# Patient Record
Sex: Male | Born: 1945 | Race: Black or African American | Hispanic: No | Smoking: Never smoker
Health system: Southern US, Community
[De-identification: ages and names within clinical notes are randomized; demographics above are authoritative.]

## PROBLEM LIST (undated history)

## (undated) DIAGNOSIS — E785 Hyperlipidemia, unspecified: Secondary | ICD-10-CM

## (undated) DIAGNOSIS — I10 Essential (primary) hypertension: Secondary | ICD-10-CM

## (undated) DIAGNOSIS — C801 Malignant (primary) neoplasm, unspecified: Secondary | ICD-10-CM

## (undated) DIAGNOSIS — Z87442 Personal history of urinary calculi: Secondary | ICD-10-CM

## (undated) DIAGNOSIS — R3129 Other microscopic hematuria: Secondary | ICD-10-CM

## (undated) DIAGNOSIS — K8020 Calculus of gallbladder without cholecystitis without obstruction: Secondary | ICD-10-CM

## (undated) HISTORY — PX: COLON SURGERY: SHX602

## (undated) HISTORY — DX: Calculus of gallbladder without cholecystitis without obstruction: K80.20

## (undated) HISTORY — DX: Hyperlipidemia, unspecified: E78.5

## (undated) HISTORY — DX: Essential (primary) hypertension: I10

## (undated) HISTORY — PX: COLONOSCOPY: SHX174

## (undated) HISTORY — DX: Other microscopic hematuria: R31.29

---

## 2004-11-30 ENCOUNTER — Ambulatory Visit: Payer: Self-pay | Admitting: Family Medicine

## 2005-07-27 ENCOUNTER — Ambulatory Visit: Payer: Self-pay | Admitting: Family Medicine

## 2005-12-29 ENCOUNTER — Ambulatory Visit: Payer: Self-pay | Admitting: Family Medicine

## 2006-11-14 ENCOUNTER — Ambulatory Visit: Payer: Self-pay | Admitting: Family Medicine

## 2010-06-09 ENCOUNTER — Encounter: Payer: Self-pay | Admitting: Gastroenterology

## 2010-06-10 ENCOUNTER — Ambulatory Visit: Payer: Self-pay | Admitting: Gastroenterology

## 2010-06-10 ENCOUNTER — Ambulatory Visit (HOSPITAL_COMMUNITY): Admission: RE | Admit: 2010-06-10 | Discharge: 2010-06-10 | Payer: Self-pay | Admitting: Gastroenterology

## 2010-06-17 ENCOUNTER — Encounter: Payer: Self-pay | Admitting: Gastroenterology

## 2010-06-17 DIAGNOSIS — C189 Malignant neoplasm of colon, unspecified: Secondary | ICD-10-CM | POA: Insufficient documentation

## 2010-06-18 ENCOUNTER — Encounter: Payer: Self-pay | Admitting: Gastroenterology

## 2010-06-19 LAB — CONVERTED CEMR LAB
ALT: 25 units/L (ref 0–53)
AST: 23 units/L (ref 0–37)
Alkaline Phosphatase: 53 units/L (ref 39–117)
Basophils Relative: 1 % (ref 0–1)
Creatinine, Ser: 1.13 mg/dL (ref 0.40–1.50)
Eosinophils Absolute: 0.1 10*3/uL (ref 0.0–0.7)
INR: 0.98 (ref <1.50)
MCHC: 34.1 g/dL (ref 30.0–36.0)
MCV: 90.7 fL (ref 78.0–100.0)
Monocytes Relative: 11 % (ref 3–12)
Neutro Abs: 2.3 10*3/uL (ref 1.7–7.7)
Neutrophils Relative %: 61 % (ref 43–77)
Platelets: 194 10*3/uL (ref 150–400)
Prothrombin Time: 13.2 s (ref 11.6–15.2)
RBC: 5.07 M/uL (ref 4.22–5.81)
Sodium: 136 meq/L (ref 135–145)
Total Bilirubin: 0.7 mg/dL (ref 0.3–1.2)
Total Protein: 7 g/dL (ref 6.0–8.3)
WBC: 3.8 10*3/uL — ABNORMAL LOW (ref 4.0–10.5)

## 2010-06-22 ENCOUNTER — Ambulatory Visit (HOSPITAL_COMMUNITY): Admission: RE | Admit: 2010-06-22 | Discharge: 2010-06-22 | Payer: Self-pay | Admitting: Gastroenterology

## 2010-09-04 ENCOUNTER — Encounter (INDEPENDENT_AMBULATORY_CARE_PROVIDER_SITE_OTHER): Payer: Self-pay | Admitting: General Surgery

## 2010-09-04 ENCOUNTER — Inpatient Hospital Stay (HOSPITAL_COMMUNITY): Admission: RE | Admit: 2010-09-04 | Discharge: 2010-09-06 | Payer: Self-pay | Admitting: General Surgery

## 2010-11-14 ENCOUNTER — Encounter: Payer: Self-pay | Admitting: Unknown Physician Specialty

## 2010-11-24 NOTE — Letter (Signed)
Summary: referral to [facility]  referral to [facility]   Imported By: Peggyann Shoals 06/18/2010 12:46:25  _____________________________________________________________________  External Attachment:    Type:   Image     Comment:   External Document

## 2010-11-24 NOTE — Letter (Signed)
Summary: CT order   CT order   Imported By: Peggyann Shoals 06/18/2010 13:11:58  _____________________________________________________________________  External Attachment:    Type:   Image     Comment:   External Document

## 2010-11-24 NOTE — Miscellaneous (Signed)
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

## 2010-11-24 NOTE — Letter (Signed)
Summary: TRIAGE  TRIAGE   Imported By: Rexene Alberts 06/09/2010 09:13:14  _____________________________________________________________________  External Attachment:    Type:   Image     Comment:   External Document

## 2011-01-05 LAB — CBC
HCT: 36.7 % — ABNORMAL LOW (ref 39.0–52.0)
HCT: 41.4 % (ref 39.0–52.0)
HCT: 48.2 % (ref 39.0–52.0)
Hemoglobin: 12.8 g/dL — ABNORMAL LOW (ref 13.0–17.0)
Hemoglobin: 14 g/dL (ref 13.0–17.0)
Hemoglobin: 16.1 g/dL (ref 13.0–17.0)
MCH: 30.8 pg (ref 26.0–34.0)
MCH: 31.6 pg (ref 26.0–34.0)
MCHC: 33.3 g/dL (ref 30.0–36.0)
MCHC: 34.7 g/dL (ref 30.0–36.0)
MCV: 90.9 fL (ref 78.0–100.0)
MCV: 92.2 fL (ref 78.0–100.0)
MCV: 92.4 fL (ref 78.0–100.0)
RBC: 4.04 MIL/uL — ABNORMAL LOW (ref 4.22–5.81)
RDW: 12.6 % (ref 11.5–15.5)
WBC: 5.9 10*3/uL (ref 4.0–10.5)

## 2011-01-05 LAB — COMPREHENSIVE METABOLIC PANEL
ALT: 23 U/L (ref 0–53)
BUN: 10 mg/dL (ref 6–23)
CO2: 29 mEq/L (ref 19–32)
Calcium: 9.8 mg/dL (ref 8.4–10.5)
GFR calc non Af Amer: >60 mL/min (ref >60)
Glucose, Bld: 126 mg/dL — ABNORMAL HIGH (ref 70–99)
Sodium: 137 mEq/L (ref 135–145)

## 2011-01-05 LAB — DIFFERENTIAL
Basophils Relative: 0 % (ref 0–1)
Basophils Relative: 0 % (ref 0–1)
Eosinophils Absolute: 0 10*3/uL (ref 0.0–0.7)
Eosinophils Relative: 0 % (ref 0–5)
Lymphocytes Relative: 10 % — ABNORMAL LOW (ref 12–46)
Lymphs Abs: 0.8 10*3/uL (ref 0.7–4.0)
Monocytes Absolute: 0.5 10*3/uL (ref 0.1–1.0)
Monocytes Relative: 9 % (ref 3–12)
Neutrophils Relative %: 78 % — ABNORMAL HIGH (ref 43–77)
Neutrophils Relative %: 82 % — ABNORMAL HIGH (ref 43–77)

## 2011-01-05 LAB — BASIC METABOLIC PANEL
CO2: 27 mEq/L (ref 19–32)
Calcium: 8.9 mg/dL (ref 8.4–10.5)
Chloride: 103 mEq/L (ref 96–112)
Chloride: 106 mEq/L (ref 96–112)
GFR calc Af Amer: >60 mL/min (ref >60)
GFR calc non Af Amer: >60 mL/min (ref >60)
Glucose, Bld: 125 mg/dL — ABNORMAL HIGH (ref 70–99)
Glucose, Bld: 130 mg/dL — ABNORMAL HIGH (ref 70–99)
Potassium: 3.7 mEq/L (ref 3.5–5.1)
Potassium: 3.7 mEq/L (ref 3.5–5.1)
Sodium: 134 mEq/L — ABNORMAL LOW (ref 135–145)
Sodium: 138 mEq/L (ref 135–145)

## 2011-01-05 LAB — PHOSPHORUS: Phosphorus: 3.2 mg/dL (ref 2.3–4.6)

## 2011-01-05 LAB — ALBUMIN: Albumin: 3.2 g/dL — ABNORMAL LOW (ref 3.5–5.2)

## 2011-01-05 LAB — TYPE AND SCREEN: Antibody Screen: NEGATIVE

## 2011-01-05 LAB — SURGICAL PCR SCREEN: Staphylococcus aureus: NEGATIVE

## 2011-05-27 ENCOUNTER — Encounter: Payer: Self-pay | Admitting: Gastroenterology

## 2011-06-10 ENCOUNTER — Ambulatory Visit: Payer: Self-pay | Admitting: Gastroenterology

## 2011-07-15 ENCOUNTER — Ambulatory Visit (INDEPENDENT_AMBULATORY_CARE_PROVIDER_SITE_OTHER): Payer: PRIVATE HEALTH INSURANCE | Admitting: Gastroenterology

## 2011-07-15 ENCOUNTER — Encounter: Payer: Self-pay | Admitting: Gastroenterology

## 2011-07-15 VITALS — BP 130/82 | HR 84 | Temp 97.6°F | Ht 74.0 in | Wt 221.2 lb

## 2011-07-15 DIAGNOSIS — C189 Malignant neoplasm of colon, unspecified: Secondary | ICD-10-CM

## 2011-07-15 NOTE — Progress Notes (Signed)
Cc to PCP 

## 2011-07-15 NOTE — Progress Notes (Signed)
Reminder in epic to have tcs in 2015 and 2020

## 2011-07-15 NOTE — Assessment & Plan Note (Signed)
TCS 2012, 2015, 2020. MOVIPREP OPV PRN

## 2011-07-15 NOTE — Progress Notes (Signed)
  Subjective:    Patient ID: Scott Oliver, male    DOB: June 02, 1946, 65 y.o.   MRN: [id number]  PCP: [facility]  HPI Pt had HEPATIC FLEXURE polyp containing ADENOCA NOV 2011-R HEMICOLECTOMY-no residual CA or POS LNS. Appetite: good. USU. BMS: IN am & pm. ONCE TAKES PREP, GETS BOWELS OUT OF WACK-1 week or so and they get right.  No problems with sedation. No nausea, vomiting, melena, diarrhea, constipation, aBd pain, OR problems swallowing. RARE heartburn or indigestion, relieved by Nexium.  Past Medical History  Diagnosis Date  . HTN (hypertension)   . Hyperlipemia     Past Surgical History  Procedure Date  . Colon surgery NOV 2011 MJ    R HEMI  . Colonoscopy AUG 2011 ARS    HF POLYP W/ FOCI OF ADENO CA, SIMPLE ADENOMAS    No Known Allergies  Current Outpatient Prescriptions  Medication Sig Dispense Refill  . aspirin 81 MG tablet Take 81 mg by mouth daily.        Marland Kitchen lisinopril-hydrochlorothiazide (PRINZIDE,ZESTORETIC) 20-12.5 MG per tablet Take 1 tablet by mouth daily.        . Omega-3 Fatty Acids (FISH OIL) 1000 MG CPDR Take by mouth.        . Pitavastatin Calcium 4 MG TABS Take by mouth.         Family History  Problem Relation Age of Onset  . Colon cancer Neg Hx   . Colon polyps Neg Hx    History   Social History Narrative   HIS WIFE, [name], PASSED AWAY RECENTLY.            Review of Systems     Objective:   Physical Exam  Constitutional: He is oriented to person, place, and time. He appears well-developed and well-nourished. No distress.  HENT:  Head: Normocephalic and atraumatic.  Mouth/Throat: Oropharynx is clear and moist. No oropharyngeal exudate.  Eyes: Pupils are equal, round, and reactive to light. No scleral icterus.  Neck: Normal range of motion. Neck supple.  Cardiovascular: Normal rate, regular rhythm and normal heart sounds.   Pulmonary/Chest: Effort normal and breath sounds normal.  Abdominal: Soft. Bowel sounds are normal. He exhibits  no distension. There is no tenderness.  Musculoskeletal: Normal range of motion. He exhibits no edema.  Lymphadenopathy:    He has no cervical adenopathy.  Neurological: He is alert and oriented to person, place, and time.       No focal deficits  Psychiatric: He has a normal mood and affect.          Assessment & Plan:

## 2011-07-24 ENCOUNTER — Emergency Department (HOSPITAL_COMMUNITY)
Admission: EM | Admit: 2011-07-24 | Discharge: 2011-07-25 | Disposition: A | Discharge status: Home / Self Care | Payer: Medicare Other | Attending: Emergency Medicine | Admitting: Emergency Medicine

## 2011-07-24 ENCOUNTER — Encounter (HOSPITAL_COMMUNITY): Payer: Self-pay | Admitting: *Deleted

## 2011-07-24 DIAGNOSIS — N200 Calculus of kidney: Secondary | ICD-10-CM | POA: Insufficient documentation

## 2011-07-24 DIAGNOSIS — R109 Unspecified abdominal pain: Secondary | ICD-10-CM | POA: Insufficient documentation

## 2011-07-24 DIAGNOSIS — R110 Nausea: Secondary | ICD-10-CM | POA: Insufficient documentation

## 2011-07-24 NOTE — ED Notes (Signed)
Pt reports pain in left flank previously today, radiating to left groin with urination.  Pt did report that he had some difficulty urinating earlier.  States that he was able to void some, and at this time, is not experiencing any pain at all. Pt ambulating with no difficulty.  To BR to provide urine sample.

## 2011-07-24 NOTE — ED Notes (Signed)
Pt c/o left flank pain radiating around to lower abdomen.

## 2011-07-25 ENCOUNTER — Emergency Department (HOSPITAL_COMMUNITY): Payer: Medicare Other

## 2011-07-25 LAB — URINE MICROSCOPIC-ADD ON

## 2011-07-25 LAB — URINALYSIS, ROUTINE W REFLEX MICROSCOPIC
Bilirubin Urine: NEGATIVE
Glucose, UA: NEGATIVE mg/dL
Ketones, ur: NEGATIVE mg/dL
pH: 6 (ref 5.0–8.0)

## 2011-07-25 MED ORDER — PROMETHAZINE HCL 25 MG PO TABS: 25.0000 mg | ORAL_TABLET | Freq: Four times a day (QID) | ORAL | Status: AC | PRN

## 2011-07-25 MED ORDER — HYDROCODONE-ACETAMINOPHEN 5-325 MG PO TABS: 2.0000 | ORAL_TABLET | ORAL | Status: AC | PRN

## 2011-07-25 NOTE — ED Provider Notes (Signed)
History     CSN: [id number] Arrival date & time: 07/24/2011 11:29 PM  Chief Complaint  Patient presents with  . Flank Pain  . Nausea    (Consider location/radiation/quality/duration/timing/severity/associated sxs/prior treatment) HPI Comments: Examined at 0001. Patient with remote h/o kidney stone here with flank pain with radiation to left lower abdomen. Difficulty urinating initially, now able to urinate. Denies hematuria, fever, chills. Pain ahs improved with no intervention.  Patient is a 65 y.o. male presenting with flank pain.  Flank Pain This is a new problem. The current episode started 1 to 2 hours ago. The problem occurs constantly. The problem has been rapidly improving. Associated symptoms include abdominal pain. Pertinent negatives include no chest pain and no headaches. Associated symptoms comments: Left flank pain with radiation to left lower abdomen. The symptoms are relieved by nothing. He has tried nothing for the symptoms.    Past Medical History  Diagnosis Date  . HTN (hypertension)   . Hyperlipemia     Past Surgical History  Procedure Date  . Colon surgery NOV 2011 MJ    R HEMI  . Colonoscopy AUG 2011 ARS    HF POLYP W/ FOCI OF ADENO CA, SIMPLE ADENOMAS    Family History  Problem Relation Age of Onset  . Colon cancer Neg Hx   . Colon polyps Neg Hx     History  Substance Use Topics  . Smoking status: Never Smoker   . Smokeless tobacco: Not on file  . Alcohol Use: Yes     ocassional      Review of Systems  Cardiovascular: Negative for chest pain.  Gastrointestinal: Positive for abdominal pain.  Genitourinary: Positive for flank pain.  Neurological: Negative for headaches.  All other systems reviewed and are negative.    Allergies  Review of patient's allergies indicates no known allergies.  Home Medications   Current Outpatient Rx  Name Route Sig Dispense Refill  . LIVALO PO Oral Take by mouth.      . ASPIRIN 81 MG PO TABS Oral Take  81 mg by mouth daily.      Marland Kitchen LISINOPRIL-HYDROCHLOROTHIAZIDE 20-12.5 MG PO TABS Oral Take 1 tablet by mouth daily.      Marland Kitchen FISH OIL 1000 MG PO CPDR Oral Take by mouth.        BP 159/82  Pulse 88  Temp(Src) 98.2 F (36.8 C) (Oral)  Resp 16  Ht 6\' 2"  (1.88 m)  Wt 222 lb (100.699 kg)  BMI 28.50 kg/m2  SpO2 97%  Physical Exam  Nursing note and vitals reviewed. Constitutional: He is oriented to person, place, and time. He appears well-developed and well-nourished.  HENT:  Head: Normocephalic.  Right Ear: External ear normal.  Left Ear: External ear normal.  Mouth/Throat: Oropharynx is clear and moist.  Eyes: EOM are normal.  Neck: Normal range of motion. Neck supple.  Cardiovascular: Normal rate, normal heart sounds and intact distal pulses.   Pulmonary/Chest: Effort normal and breath sounds normal.  Abdominal: Soft. Bowel sounds are normal. There is no tenderness. There is no rebound and no guarding.  Genitourinary:       No cva tenderness to percussion  Musculoskeletal: Normal range of motion.  Neurological: He is alert and oriented to person, place, and time.  Skin: Skin is warm and dry.    ED Course  Procedures (including critical care time)  Labs Reviewed  URINALYSIS, ROUTINE W REFLEX MICROSCOPIC - Abnormal; Notable for the following:    Hgb urine dipstick  LARGE (*)    All other components within normal limits  URINE MICROSCOPIC-ADD ON   Ct Abdomen Pelvis Wo Contrast  07/25/2011  *RADIOLOGY REPORT*  Clinical Data: Left flank pain, evaluate for stone  CT ABDOMEN AND PELVIS WITHOUT CONTRAST  Technique:  Multidetector CT imaging of the abdomen and pelvis was performed following the standard protocol without intravenous contrast.  Comparison: 06/22/2010  Findings: Mild scarring in the right middle lobe and lingula.  Tiny hiatal hernia.  Unenhanced liver, spleen, pancreas, and adrenal glands within normal limits.  Cholelithiasis, without associate inflammatory changes.  Left  kidney is notable for a 2 mm nonobstructing upper pole calculus (series 2/image 24).  Right kidney is unremarkable.  No hydronephrosis.  Status post right hemicolectomy.  No evidence of bowel obstruction.  Atherosclerotic calcifications of the abdominal aorta and branch vessels.  No abdominopelvic ascites.  No suspicious abdominopelvic lymphadenopathy.  Prostate is unremarkable.  Mild stranding surrounding the mid/distal left ureter. Punctate calculus at the left UVJ (series 2/image 85).  Bladder is within normal limits.  Mild degenerative changes of the visualized thoracolumbar spine. Moderate degenerative changes of the bilateral hips.  IMPRESSION: Punctate calculus in the distal left ureter at the UVJ.  2 mm nonobstructing left upper pole calculus.  No hydronephrosis.  Cholelithiasis, without associated inflammatory changes.  Status post right hemicolectomy.  Original Report Authenticated By: Charline Bills, M.D.     No diagnosis found.   Patient with left flank pain and left lower abdominal pain c/w kidney stone. CT confirmed punctate stone at LUVJ. Patient is painfree. Reviewed CT results with patient and his family. Patient and familyr informed of clinical course, understand medical decision-making process, and agree with plan. MDM Reviewed: nursing note and vitals Interpretation: CT scan              Nicoletta Dress. Colon Branch, MD 07/25/11 6213

## 2011-08-04 ENCOUNTER — Telehealth: Payer: Self-pay | Admitting: Gastroenterology

## 2011-08-04 MED ORDER — ESOMEPRAZOLE MAGNESIUM 40 MG PO CPDR: 40.0000 mg | DELAYED_RELEASE_CAPSULE | Freq: Every day | ORAL | Status: DC

## 2011-08-04 NOTE — Telephone Encounter (Signed)
Pt returned call and was informed.  

## 2011-08-04 NOTE — Telephone Encounter (Signed)
Please call pt. Let him know I sent Rx to [facility] for Nexium.

## 2011-08-04 NOTE — Telephone Encounter (Signed)
Called, VM not set up.  

## 2011-08-04 NOTE — Telephone Encounter (Signed)
Pt has not been prescribed Nexium. Said he told Dr. Darrick Penna when he was here that he had taken some of his wife's and said it was OK if he took some, but he failed to get prescription or samples. #10 given today per Dr. Darrick Penna.

## 2011-08-04 NOTE — Telephone Encounter (Signed)
Pt would like samples of Nexium

## 2011-08-04 NOTE — Telephone Encounter (Signed)
Addended by: [place] on: 08/04/2011 11:54 AM   Modules accepted: Orders

## 2011-08-09 ENCOUNTER — Ambulatory Visit (HOSPITAL_COMMUNITY)
Admission: RE | Admit: 2011-08-09 | Discharge: 2011-08-09 | Disposition: A | Discharge status: Home / Self Care | Payer: Medicare Other | Source: Ambulatory Visit | Attending: Gastroenterology | Admitting: Gastroenterology

## 2011-08-09 ENCOUNTER — Encounter (HOSPITAL_COMMUNITY): Payer: Self-pay | Admitting: *Deleted

## 2011-08-09 ENCOUNTER — Encounter (HOSPITAL_COMMUNITY)
Admission: RE | Disposition: A | Discharge status: Home / Self Care | Payer: Self-pay | Source: Ambulatory Visit | Attending: Gastroenterology

## 2011-08-09 DIAGNOSIS — K5730 Diverticulosis of large intestine without perforation or abscess without bleeding: Secondary | ICD-10-CM

## 2011-08-09 DIAGNOSIS — Z860100 Personal history of colon polyps, unspecified: Secondary | ICD-10-CM

## 2011-08-09 DIAGNOSIS — K648 Other hemorrhoids: Secondary | ICD-10-CM

## 2011-08-09 DIAGNOSIS — E785 Hyperlipidemia, unspecified: Secondary | ICD-10-CM | POA: Insufficient documentation

## 2011-08-09 DIAGNOSIS — Z7982 Long term (current) use of aspirin: Secondary | ICD-10-CM | POA: Insufficient documentation

## 2011-08-09 DIAGNOSIS — I10 Essential (primary) hypertension: Secondary | ICD-10-CM | POA: Insufficient documentation

## 2011-08-09 DIAGNOSIS — Z09 Encounter for follow-up examination after completed treatment for conditions other than malignant neoplasm: Secondary | ICD-10-CM

## 2011-08-09 DIAGNOSIS — Z9049 Acquired absence of other specified parts of digestive tract: Secondary | ICD-10-CM | POA: Insufficient documentation

## 2011-08-09 DIAGNOSIS — Z79899 Other long term (current) drug therapy: Secondary | ICD-10-CM | POA: Insufficient documentation

## 2011-08-09 DIAGNOSIS — C189 Malignant neoplasm of colon, unspecified: Secondary | ICD-10-CM

## 2011-08-09 DIAGNOSIS — Z85038 Personal history of other malignant neoplasm of large intestine: Secondary | ICD-10-CM | POA: Insufficient documentation

## 2011-08-09 HISTORY — PX: COLONOSCOPY: SHX5424

## 2011-08-09 SURGERY — COLONOSCOPY
Anesthesia: Moderate Sedation

## 2011-08-09 MED ORDER — MIDAZOLAM HCL 5 MG/5ML IJ SOLN
INTRAMUSCULAR | Status: DC | PRN
  Administered 2011-08-09 (×2): 2 mg via INTRAVENOUS

## 2011-08-09 MED ORDER — MIDAZOLAM HCL 5 MG/5ML IJ SOLN
INTRAMUSCULAR | Status: AC
  Filled 2011-08-09: qty 10

## 2011-08-09 MED ORDER — MEPERIDINE HCL 100 MG/ML IJ SOLN
INTRAMUSCULAR | Status: DC | PRN
  Administered 2011-08-09: 50 mg via INTRAVENOUS
  Administered 2011-08-09: 25 mg via INTRAVENOUS

## 2011-08-09 MED ORDER — SODIUM CHLORIDE 0.45 % IV SOLN: Freq: Once | INTRAVENOUS | Status: DC

## 2011-08-09 MED ORDER — MEPERIDINE HCL 100 MG/ML IJ SOLN
INTRAMUSCULAR | Status: AC
  Filled 2011-08-09: qty 2

## 2011-08-09 NOTE — H&P (Signed)
Reason for Visit     consult for colonoscopy        Vitals - Last Recorded       BP Pulse Temp(Src) Ht Wt BMI    130/82  84  97.6 F (36.4 C) (Temporal)  6\' 2"  (1.88 m)  221 lb 3.2 oz (100.336 kg)  28.40 kg/m2       Progress Notes     [name], MD  07/15/2011 12:01 PM  Signed    Subjective:      Patient ID: Scott Oliver, male    DOB: [birth year], 65 y.o.   MRN: [id number]   PCP: [facility]   HPI Pt had HEPATIC FLEXURE polyp containing ADENOCA NOV 2011-R HEMICOLECTOMY-no residual CA or POS LNS. Appetite: good. USU. BMS: IN am & pm. ONCE TAKES PREP, GETS BOWELS OUT OF WACK-1 week or so and they get right.   No problems with sedation. No nausea, vomiting, melena, diarrhea, constipation, aBd pain, OR problems swallowing. RARE heartburn or indigestion, relieved by Nexium.    Past Medical History   Diagnosis  Date   .  HTN (hypertension)     .  Hyperlipemia         Past Surgical History   Procedure  Date   .  Colon surgery  NOV 2011 [facility]J       R HEMI   .  Colonoscopy  AUG 2011 ARS       HF POLYP W/ FOCI OF ADENO CA, SIMPLE ADENOMAS      No Known Allergies    Current Outpatient Prescriptions   Medication  Sig  Dispense  Refill   .  aspirin 81 MG tablet  Take 81 mg by mouth daily.           Marland Kitchen  lisinopril-hydrochlorothiazide (PRINZIDE,ZESTORETIC) 20-12.5 MG per tablet  Take 1 tablet by mouth daily.           .  Omega-3 Fatty Acids (FISH OIL) 1000 MG CPDR  Take by mouth.           .  Pitavastatin Calcium 4 MG TABS  Take by mouth.            Family History   Problem  Relation  Age of Onset   .  Colon cancer  Neg Hx     .  Colon polyps  Neg Hx      History       Social History Narrative     HIS WIFE, LUCY Olenik, PASSED AWAY RECENTLY.                    Review of Systems     Objective:    Physical Exam  Constitutional: He is oriented to person, place, and time. He appears well-developed and well-nourished. No distress.  HENT:   Head:  Normocephalic and atraumatic.   Mouth/Throat: Oropharynx is clear and moist. No oropharyngeal exudate.  Eyes: Pupils are equal, round, and reactive to light. No scleral icterus.  Neck: Normal range of motion. Neck supple.  Cardiovascular: Normal rate, regular rhythm and normal heart sounds.   Pulmonary/Chest: Effort normal and breath sounds normal.  Abdominal: Soft. Bowel sounds are normal. He exhibits no distension. There is no tenderness.  Musculoskeletal: Normal range of motion. He exhibits no edema.  Lymphadenopathy:    He has no cervical adenopathy.  Neurological: He is alert and oriented to person, place, and time.  No focal deficits  Psychiatric: He has a normal mood and affect.            Assessment & Plan:        Scott Oliver  07/15/2011  1:31 PM  Signed Cc to PCP  Ferne Reus  07/15/2011  4:42 PM  Signed Reminder in epic to have tcs in 2015 and 2020        ADENOCARCINOMA, COLON - [name], MD  07/15/2011 11:48 AM  Signed TCS 2012, 2015, 2020. MOVIPREP OPV PRN

## 2011-08-09 NOTE — Interval H&P Note (Signed)
History and Physical Interval Note:   08/09/2011   7:41 AM   Scott Oliver  has presented today for surgery, with the diagnosis of CANEROUS POLYP  The various methods of treatment have been discussed with the patient and family. After consideration of risks, benefits and other options for treatment, the patient has consented to  Procedure(s): COLONOSCOPY as a surgical intervention .  I have reviewed the patients' chart and labs.  Questions were answered to the patient's satisfaction.     [name]  MD  THE PATIENT WAS EXAMINED AND THERE IS NO CHANGE IN THE PATIENT'S CONDITION SINCE THE ORIGINAL H&P WAS COMPLETED.

## 2011-08-17 ENCOUNTER — Encounter (HOSPITAL_COMMUNITY): Payer: Self-pay | Admitting: Gastroenterology

## 2012-02-29 IMAGING — CT CT ABD-PELV W/O CM
2 of 4 series · 17 of 46 positions shown, 19 images · non-contrast
Comparison: 06/22/2010

CLINICAL DATA: Left flank pain, evaluate for stone

CT ABDOMEN AND PELVIS WITHOUT CONTRAST
TECHNIQUE: Multidetector CT imaging of the abdomen and pelvis was
performed following the standard protocol without intravenous
contrast.

[Series 2: standard/full over (age)lbs 5.0 · axial · 0.80mm/px · z∈[-550,-70]mm · 14 of 106 slices shown, 16 images]
[im 5/106  soft-tissue]
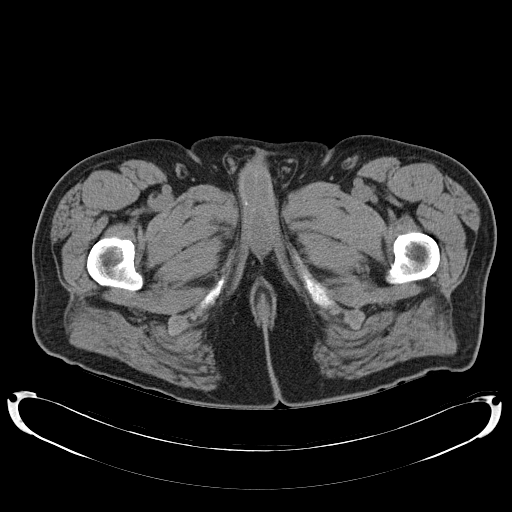
[im 5/106  bone]
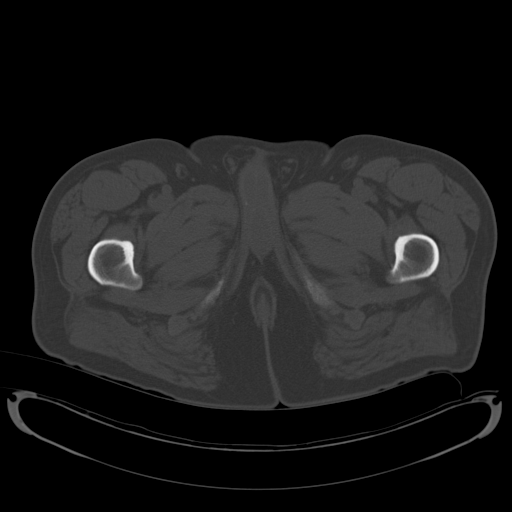
[im 13/106  soft-tissue]
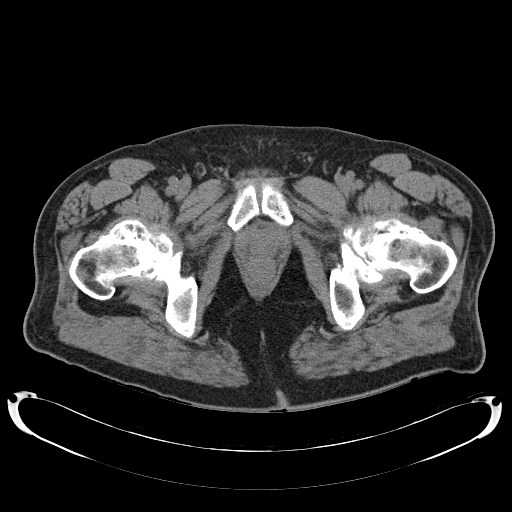
[im 22/106  soft-tissue]
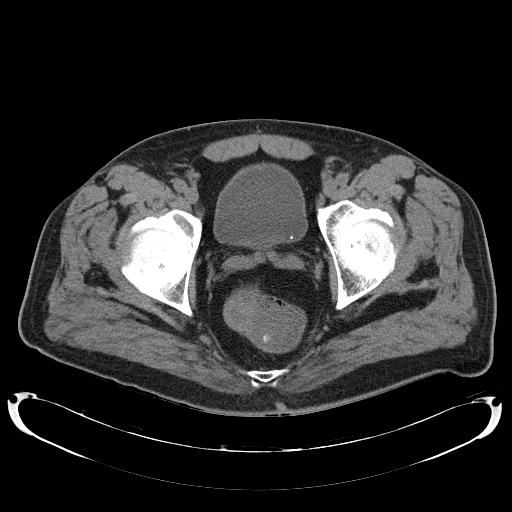
[im 30/106  soft-tissue]
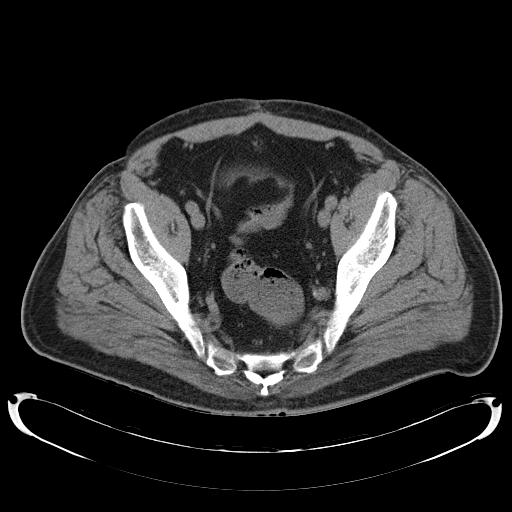
[im 34/106  soft-tissue]
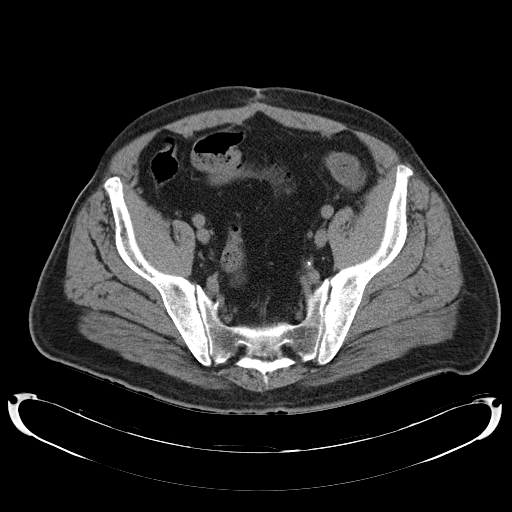
[im 43/106  soft-tissue]
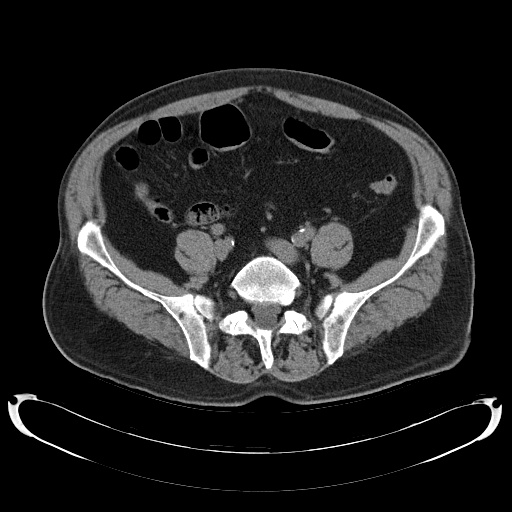
[im 51/106  soft-tissue]
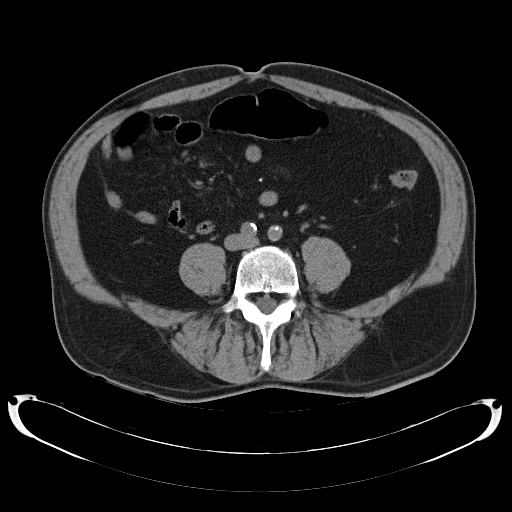
[im 55/106  soft-tissue]
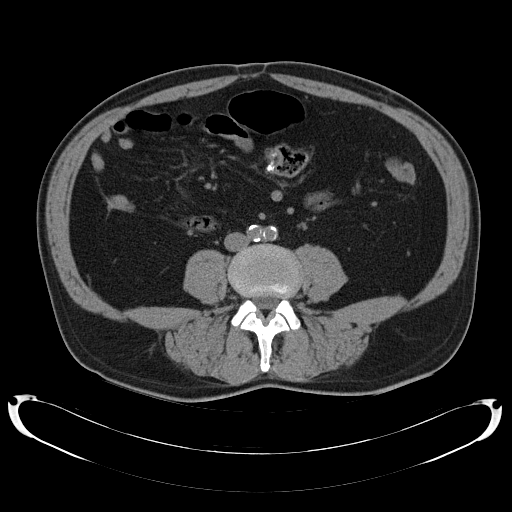
[im 64/106  soft-tissue]
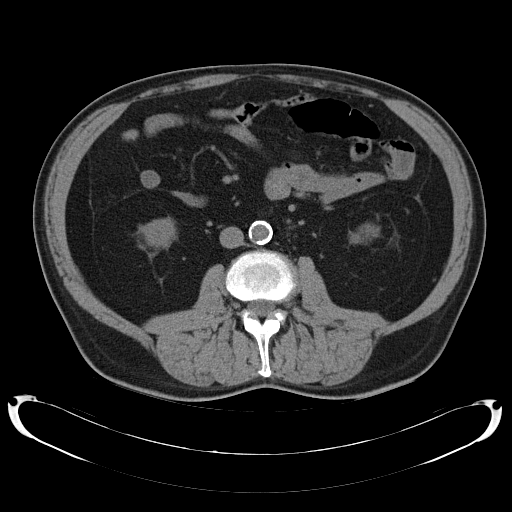
[im 64/106  bone]
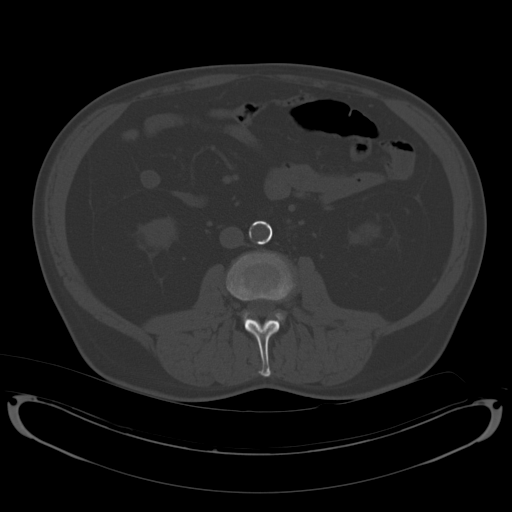
[im 72/106  soft-tissue]
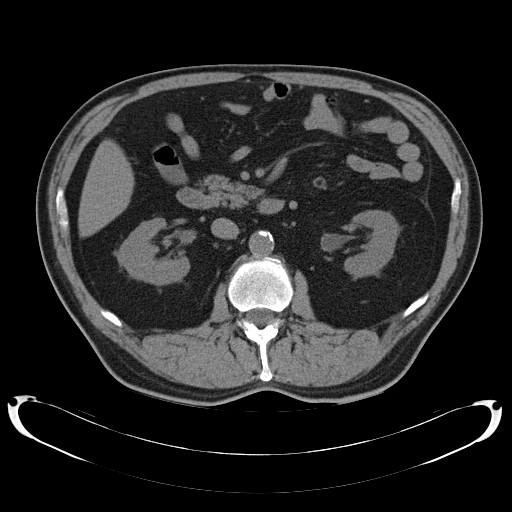
[im 80/106  soft-tissue]
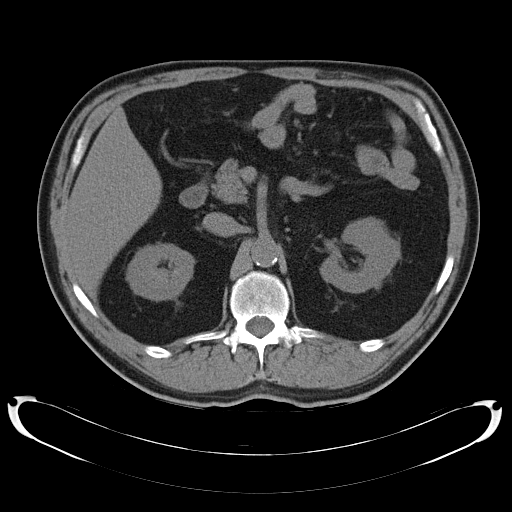
[im 85/106  soft-tissue]
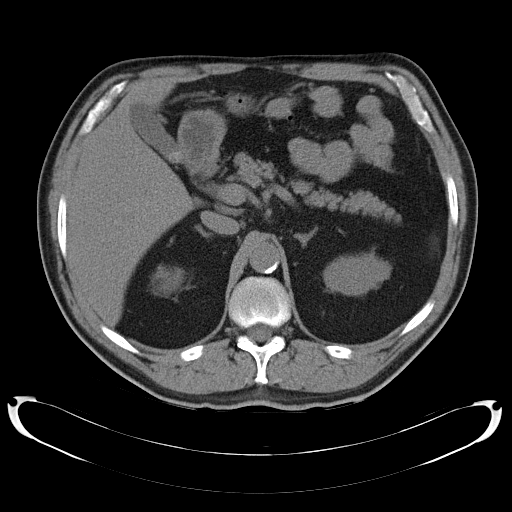
[im 93/106  soft-tissue]
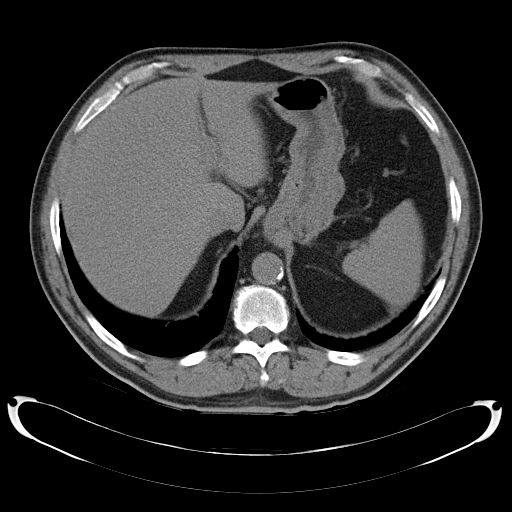
[im 101/106  soft-tissue]
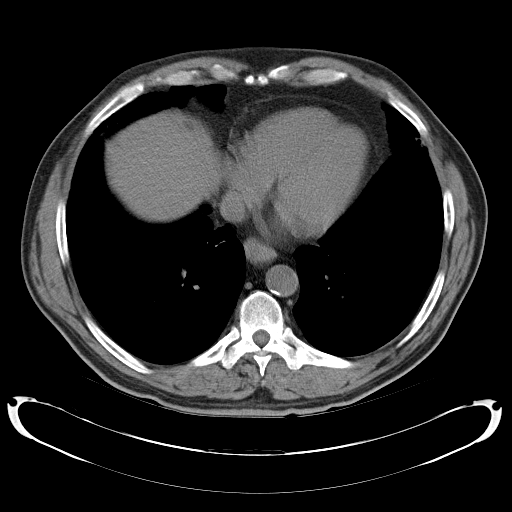

[Series 4: mpr coronal · coronal · 1.04mm/px · 3 of 89 slices shown]
[im 30/89  soft-tissue]
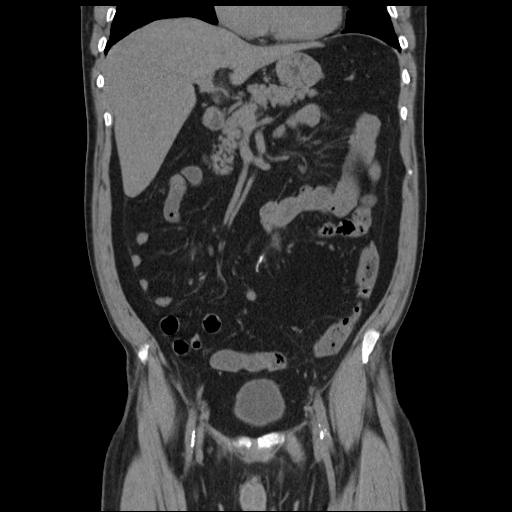
[im 40/89  soft-tissue]
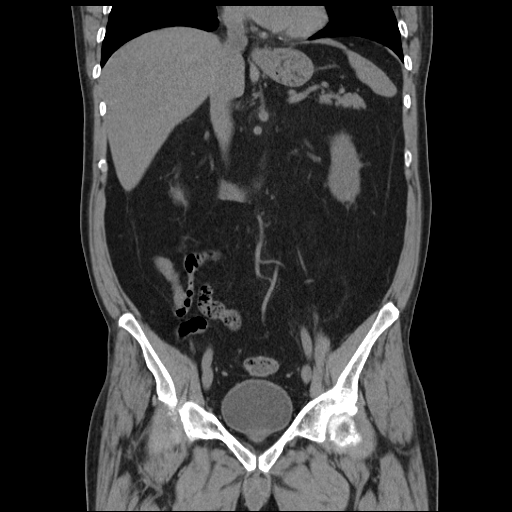
[im 49/89  soft-tissue]
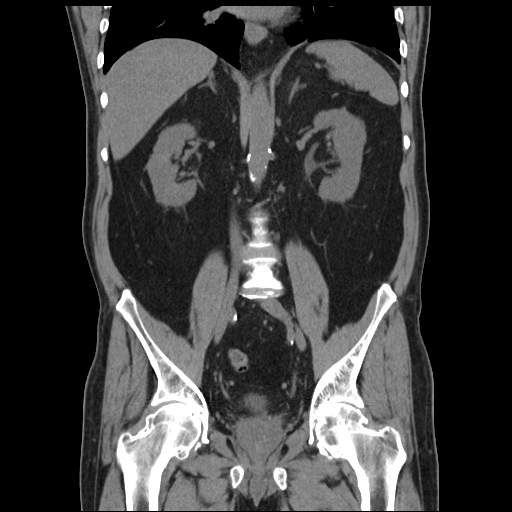

[17 of 46 positions shown; findings below may reference images not displayed]

FINDINGS: Mild scarring in the right middle lobe and lingula.

Tiny hiatal hernia.

Unenhanced liver, spleen, pancreas, and adrenal glands within
normal limits.

Cholelithiasis, without associate inflammatory changes.

Left kidney is notable for a 2 mm nonobstructing upper pole
calculus (series 2/image 24).  Right kidney is unremarkable.  No
hydronephrosis.

Status post right hemicolectomy.  No evidence of bowel obstruction.

Atherosclerotic calcifications of the abdominal aorta and branch
vessels.

No abdominopelvic ascites.

No suspicious abdominopelvic lymphadenopathy.

Prostate is unremarkable.

Mild stranding surrounding the mid/distal left ureter. Punctate
calculus at the left UVJ (series 2/image 85).  Bladder is within
normal limits.

Mild degenerative changes of the visualized thoracolumbar spine.
Moderate degenerative changes of the bilateral hips.
IMPRESSION: Punctate calculus in the distal left ureter at the UVJ.

2 mm nonobstructing left upper pole calculus.  No hydronephrosis.

Cholelithiasis, without associated inflammatory changes.

Status post right hemicolectomy.

## 2013-03-07 ENCOUNTER — Other Ambulatory Visit: Payer: Self-pay | Admitting: Physician Assistant

## 2014-02-28 ENCOUNTER — Telehealth: Payer: Self-pay | Admitting: *Deleted

## 2014-02-28 NOTE — Telephone Encounter (Signed)
Pt called to schedule his colonoscopy, pt said Dr. Edrick Oh sent a referral over. Please advise [phone] or [phone]

## 2014-03-04 NOTE — Telephone Encounter (Signed)
I called pt. He is not having any problems at this time. Said he did not think it was time for it yet, I told him he is on recall for 06/2014. He is on my recall and he will try to call then also.

## 2014-06-26 ENCOUNTER — Encounter: Payer: Self-pay | Admitting: Gastroenterology

## 2014-07-10 ENCOUNTER — Telehealth: Payer: Self-pay | Admitting: Gastroenterology

## 2014-07-10 NOTE — Telephone Encounter (Signed)
Pt LMOM that he had received a letter from [facility] and would like a return call at [phone]

## 2014-07-10 NOTE — Telephone Encounter (Signed)
Pt is scheduled and OV with Neil Crouch, PA on 08/12/2014 at 10:00 AM to schedule colonoscopy. Has hx of adenocarcinoma of the colon.

## 2014-08-12 ENCOUNTER — Other Ambulatory Visit: Payer: Self-pay

## 2014-08-12 ENCOUNTER — Encounter: Payer: Self-pay | Admitting: Gastroenterology

## 2014-08-12 ENCOUNTER — Ambulatory Visit (INDEPENDENT_AMBULATORY_CARE_PROVIDER_SITE_OTHER): Payer: Medicare HMO | Admitting: Gastroenterology

## 2014-08-12 VITALS — BP 148/76 | HR 87 | Temp 97.5°F | Resp 20 | Ht 74.0 in | Wt 208.0 lb

## 2014-08-12 DIAGNOSIS — Z85038 Personal history of other malignant neoplasm of large intestine: Secondary | ICD-10-CM

## 2014-08-12 DIAGNOSIS — C189 Malignant neoplasm of colon, unspecified: Secondary | ICD-10-CM

## 2014-08-12 MED ORDER — PEG-KCL-NACL-NASULF-NA ASC-C 100 G PO SOLR: 1.0000 | ORAL | Status: DC

## 2014-08-12 NOTE — Patient Instructions (Signed)
1. Colonoscopy as scheduled. Please see separate instructions. 

## 2014-08-12 NOTE — Progress Notes (Signed)
Primary Care Physician:  Sherrie Mustache, MD  Primary Gastroenterologist:  Barney Drain, MD   Chief Complaint  Patient presents with  . Colonoscopy    HPI:  Scott Oliver is a 68 y.o. male here to schedule surveillance colonoscopy. He had a hepatic flexure polyp containing adenocarcinoma back in November 2011, require right hemicolectomy with Dr. Aviva Signs. No residual cancer or positive lymph nodes. Followup colonoscopy in 2012 was good.  No blood in stool. No constipation, diarrhea, GERD, abdominal pain, dysphagia, vomiting. No unintentional weight.   Current Outpatient Prescriptions  Medication Sig Dispense Refill  . aspirin 81 MG tablet Take 81 mg by mouth daily.        Marland Kitchen lisinopril-hydrochlorothiazide (PRINZIDE,ZESTORETIC) 20-12.5 MG per tablet Take 0.5 tablets by mouth daily.       . Pitavastatin Calcium (LIVALO PO) Take by mouth.         No current facility-administered medications for this visit.    Allergies as of 08/12/2014  . (No Known Allergies)    Past Medical History  Diagnosis Date  . HTN (hypertension)   . Hyperlipemia   . Microscopic hematuria   . Cholelithiasis   . Renal stones     Past Surgical History  Procedure Laterality Date  . Colon surgery  NOV 2011 [facility]    R HEMI  . Colonoscopy  AUG 2011 ARS    HF POLYP W/ FOCI OF ADENO CA, SIMPLE ADENOMAS  . Colonoscopy  08/09/2011    Dr. Oneida Alar. rare diverticula sigmiod colon, moderate internal hemorrhoids    Family History  Problem Relation Age of Onset  . Colon cancer Neg Hx   . Colon polyps Neg Hx   . Anesthesia problems Neg Hx   . Hypotension Neg Hx   . Malignant hyperthermia Neg Hx   . Pseudochol deficiency Neg Hx     History   Social History  . Marital Status: Married    Spouse Name: N/A    Number of Children: N/A  . Years of Education: N/A   Occupational History  . Not on file.   Social History Main Topics  . Smoking status: Never Smoker   . Smokeless tobacco: Not on file   . Alcohol Use: 1.2 oz/week    2 Cans of beer per week     Comment: ocassional  . Drug Use: No  . Sexual Activity: Not on file   Other Topics Concern  . Not on file   Social History Narrative   HIS WIFE, LUCY Degnan, PASSED AWAY RECENTLY.      ROS:  General: Negative for anorexia, weight loss, fever, chills, fatigue, weakness. Eyes: Negative for vision changes.  ENT: Negative for hoarseness, difficulty swallowing , nasal congestion. CV: Negative for chest pain, angina, palpitations, dyspnea on exertion, peripheral edema.  Respiratory: Negative for dyspnea at rest, dyspnea on exertion, cough, sputum, wheezing.  GI: See history of present illness. GU:  Negative for dysuria, hematuria, urinary incontinence, urinary frequency, nocturnal urination.  MS: Negative for joint pain, low back pain.  Derm: Negative for rash or itching.  Neuro: Negative for weakness, abnormal sensation, seizure, frequent headaches, memory loss, confusion.  Psych: Negative for anxiety, depression, suicidal ideation, hallucinations.  Endo: Negative for unusual weight change.  Heme: Negative for bruising or bleeding. Allergy: Negative for rash or hives.    Physical Examination:  BP 148/76  Pulse 87  Temp(Src) 97.5 F (36.4 C) (Oral)  Resp 20  Ht 6\' 2"  (1.88 m)  Wt 208  lb (94.348 kg)  BMI 26.69 kg/m2   General: Well-nourished, well-developed in no acute distress.  Head: Normocephalic, atraumatic.   Eyes: Conjunctiva pink, no icterus. Mouth: Oropharyngeal mucosa moist and pink , no lesions erythema or exudate. Neck: Supple without thyromegaly, masses, or lymphadenopathy.  Lungs: Clear to auscultation bilaterally.  Heart: Regular rate and rhythm, no murmurs rubs or gallops.  Abdomen: Bowel sounds are normal, nontender, nondistended, no hepatosplenomegaly or masses, no abdominal bruits or    hernia , no rebound or guarding.   Rectal: deferred. Extremities: No lower extremity edema. No clubbing or  deformities.  Neuro: Alert and oriented x 4 , grossly normal neurologically.  Skin: Warm and dry, no rash or jaundice.   Psych: Alert and cooperative, normal mood and affect.

## 2014-08-12 NOTE — Assessment & Plan Note (Signed)
Due for surveillance colonoscopy at this time. Moviprep planned.  I have discussed the risks, alternatives, benefits with regards to but not limited to the risk of reaction to medication, bleeding, infection, perforation and the patient is agreeable to proceed. Written consent to be obtained.

## 2014-08-21 NOTE — Progress Notes (Signed)
cc'ed to pcp °

## 2014-09-11 ENCOUNTER — Ambulatory Visit (HOSPITAL_COMMUNITY)
Admission: RE | Admit: 2014-09-11 | Discharge: 2014-09-11 | Disposition: A | Discharge status: Home / Self Care | Payer: Medicare HMO | Source: Ambulatory Visit | Attending: Gastroenterology | Admitting: Gastroenterology

## 2014-09-11 ENCOUNTER — Encounter (HOSPITAL_COMMUNITY): Payer: Self-pay | Admitting: *Deleted

## 2014-09-11 ENCOUNTER — Encounter (HOSPITAL_COMMUNITY)
Admission: RE | Disposition: A | Discharge status: Home / Self Care | Payer: Self-pay | Source: Ambulatory Visit | Attending: Gastroenterology

## 2014-09-11 DIAGNOSIS — K648 Other hemorrhoids: Secondary | ICD-10-CM | POA: Diagnosis not present

## 2014-09-11 DIAGNOSIS — K5730 Diverticulosis of large intestine without perforation or abscess without bleeding: Secondary | ICD-10-CM | POA: Diagnosis not present

## 2014-09-11 DIAGNOSIS — D125 Benign neoplasm of sigmoid colon: Secondary | ICD-10-CM

## 2014-09-11 DIAGNOSIS — Z1211 Encounter for screening for malignant neoplasm of colon: Secondary | ICD-10-CM | POA: Diagnosis not present

## 2014-09-11 DIAGNOSIS — Z85038 Personal history of other malignant neoplasm of large intestine: Secondary | ICD-10-CM | POA: Diagnosis not present

## 2014-09-11 DIAGNOSIS — D128 Benign neoplasm of rectum: Secondary | ICD-10-CM | POA: Insufficient documentation

## 2014-09-11 DIAGNOSIS — I10 Essential (primary) hypertension: Secondary | ICD-10-CM | POA: Diagnosis not present

## 2014-09-11 DIAGNOSIS — E785 Hyperlipidemia, unspecified: Secondary | ICD-10-CM | POA: Insufficient documentation

## 2014-09-11 HISTORY — PX: COLONOSCOPY: SHX5424

## 2014-09-11 SURGERY — COLONOSCOPY
Anesthesia: Moderate Sedation

## 2014-09-11 MED ORDER — MEPERIDINE HCL 100 MG/ML IJ SOLN
INTRAMUSCULAR | Status: DC | PRN
  Administered 2014-09-11 (×3): 25 mg via INTRAVENOUS

## 2014-09-11 MED ORDER — MIDAZOLAM HCL 5 MG/5ML IJ SOLN
INTRAMUSCULAR | Status: DC | PRN
  Administered 2014-09-11: 2 mg via INTRAVENOUS
  Administered 2014-09-11: 1 mg via INTRAVENOUS
  Administered 2014-09-11: 2 mg via INTRAVENOUS

## 2014-09-11 MED ORDER — SIMETHICONE 40 MG/0.6ML PO SUSP
ORAL | Status: DC | PRN
  Administered 2014-09-11: 09:00:00

## 2014-09-11 MED ORDER — MIDAZOLAM HCL 5 MG/5ML IJ SOLN
INTRAMUSCULAR | Status: AC
  Filled 2014-09-11: qty 10

## 2014-09-11 MED ORDER — SODIUM CHLORIDE 0.9 % IV SOLN
INTRAVENOUS | Status: DC
  Administered 2014-09-11: 08:00:00 via INTRAVENOUS

## 2014-09-11 MED ORDER — MEPERIDINE HCL 100 MG/ML IJ SOLN
INTRAMUSCULAR | Status: AC
  Filled 2014-09-11: qty 2

## 2014-09-11 NOTE — H&P (Signed)
  Primary Care Physician:  Sherrie Mustache, MD Primary Gastroenterologist:  Dr. Oneida Alar  Pre-Procedure History & Physical: HPI:  Scott Oliver is a 68 y.o. male here for  PERSONAL HISTORY OF colon cancer.   Past Medical History  Diagnosis Date  . HTN (hypertension)   . Hyperlipemia   . Microscopic hematuria   . Cholelithiasis   . Renal stones    Past Surgical History  Procedure Laterality Date  . Colon surgery  NOV 2011 [facility]    R HEMI  . Colonoscopy  AUG 2011 [facility]    HF POLYP W/ FOCI OF ADENO CA, SIMPLE ADENOMAS  . Colonoscopy  08/09/2011    Dr. Oneida Alar. rare diverticula sigmiod colon, moderate internal hemorrhoids    Prior to Admission medications   Medication Sig Start Date End Date Taking? Authorizing Provider  aspirin 81 MG tablet Take 81 mg by mouth daily.     Yes Historical Provider, MD  lisinopril-hydrochlorothiazide (PRINZIDE,ZESTORETIC) 20-12.5 MG per tablet Take 0.5 tablets by mouth daily.    Yes Historical Provider, MD  peg 3350 powder (MOVIPREP) 100 G SOLR Take 1 kit (200 g total) by mouth as directed. 08/12/14  Yes Danie Binder, MD  Pitavastatin Calcium (LIVALO) 2 MG TABS Take 1 tablet by mouth daily.   Yes Historical Provider, MD  Pitavastatin Calcium (LIVALO PO) Take by mouth.      Historical Provider, MD    Allergies as of 08/12/2014  . (No Known Allergies)    Family History  Problem Relation Age of Onset  . Colon cancer Neg Hx   . Colon polyps Neg Hx   . Anesthesia problems Neg Hx   . Hypotension Neg Hx   . Malignant hyperthermia Neg Hx   . Pseudochol deficiency Neg Hx     History   Social History  . Marital Status: Married    Spouse Name: N/A    Number of Children: N/A  . Years of Education: N/A   Occupational History  . Not on file.   Social History Main Topics  . Smoking status: Never Smoker   . Smokeless tobacco: Not on file  . Alcohol Use: 1.2 oz/week    2 Cans of beer per week     Comment: ocassional  . Drug Use: No  .  Sexual Activity: Not on file   Other Topics Concern  . Not on file   Social History Narrative   HIS WIFE, [name], PASSED AWAY RECENTLY.    Review of Systems: See HPI, otherwise negative ROS   Physical Exam: BP 131/79 mmHg  Pulse 102  Temp(Src) 97.5 F (36.4 C) (Oral)  Resp 15  Ht $R'6\' 2"'Yx$  (1.88 m)  Wt 208 lb (94.348 kg)  BMI 26.69 kg/m2  SpO2 97% General:   Alert,  pleasant and cooperative in NAD Head:  Normocephalic and atraumatic. Neck:  Supple; Lungs:  Clear throughout to auscultation.    Heart:  Regular rate and rhythm. Abdomen:  Soft, nontender and nondistended. Normal bowel sounds, without guarding, and without rebound.   Neurologic:  Alert and  oriented x4;  grossly normal neurologically.  Impression/Plan:    . PERSONAL HISTORY OF colon cancer  PLAN: 1. TCS TODAY

## 2014-09-11 NOTE — Op Note (Signed)
[facility] [address] [place], [address]   COLONOSCOPY PROCEDURE REPORT  PATIENT: Scott Oliver, Scott Oliver  MR#: [id number] BIRTHDATE: 1945/12/24 , 68  yrs. old GENDER: male ENDOSCOPIST: Barney Drain, MD REFERRED [id number] Edrick Oh, M.D. PROCEDURE DATE:  20-Sep-2014 PROCEDURE:   Colonoscopy with cold biopsy polypectomy INDICATIONS:high risk personal history of colon cancer. MEDICATIONS: Demerol 75 mg IV and Versed 5 mg IV  DESCRIPTION OF PROCEDURE:    Physical exam was performed.  Informed consent was obtained from the patient after explaining the benefits, risks, and alternatives to procedure.  The patient was connected to monitor and placed in left lateral position. Continuous oxygen was provided by nasal cannula and IV medicine administered through an indwelling cannula.  After administration of sedation and rectal exam, the patients rectum was intubated and the EC-3890Li ([id number])  colonoscope was advanced under direct visualization to the ileum.  The scope was removed slowly by carefully examining the color, texture, anatomy, and integrity mucosa on the way out.  The patient was recovered in endoscopy and discharged home in satisfactory condition.    COLON FINDINGS: The examined terminal ileum appeared to be normal. , There was mild diverticulosis noted in the sigmoid colon and descending colon with associated muscular hypertrophy.  , A sessile polyp measuring 4 mm in size was found in the sigmoid colon.  A polypectomy was performed with cold forceps.  , and Large internal hemorrhoids were found.  PREP QUALITY: good.  CECAL W/D TIME: 8 mins          COMPLICATIONS: None  ENDOSCOPIC IMPRESSION: 1.   ONE COLON POLYP REMOVED 2.   Mild diverticulosis  in the sigmoid colon and descending colon 3.   Large internal hemorrhoids  RECOMMENDATIONS: AWAIT BIOPSY HIGH FIBER DIET ALL FIRST DEGREE RELATIVES NEED TCS AT AGE 60.  DISCUSSED WITH DAUGHTER. NEXT TCS IN 5  YRS      _______________________________ eSignedBarney Drain, MD September 20, 2014 9:48 AM   CPT CODES: ICD CODES:  The ICD and CPT codes recommended by this software are interpretations from the data that the clinical staff has captured with the software.  The verification of the translation of this report to the ICD and CPT codes and modifiers is the sole responsibility of the health care institution and practicing physician where this report was generated.  [employer]. will not be held responsible for the validity of the ICD and CPT codes included on this report.  AMA assumes no liability for data contained or not contained herein. CPT is a Designer, television/film set of the [employer].

## 2014-09-11 NOTE — Discharge Instructions (Signed)
You had 1 small polyp removed. You have LARGE internal hemorrhoids and diverticulosis IN YOUR LEFT COLON.   FOLLOW A HIGH FIBER DIET. AVOID ITEMS THAT CAUSE BLOATING. SEE INFO BELOW.  YOUR BIOPSY RESULTS SHOULD BE BACK IN 14 DAYS.  Next colonoscopy in 5 years.   Colonoscopy Care After Read the instructions outlined below and refer to this sheet in the next week. These discharge instructions provide you with general information on caring for yourself after you leave the hospital. While your treatment has been planned according to the most current medical practices available, unavoidable complications occasionally occur. If you have any problems or questions after discharge, call DR. Jenae Tomasello, [phone].  ACTIVITY  You may resume your regular activity, but move at a slower pace for the next 24 hours.   Take frequent rest periods for the next 24 hours.   Walking will help get rid of the air and reduce the bloated feeling in your belly (abdomen).   No driving for 24 hours (because of the medicine (anesthesia) used during the test).   You may shower.   Do not sign any important legal documents or operate any machinery for 24 hours (because of the anesthesia used during the test).    NUTRITION  Drink plenty of fluids.   You may resume your normal diet as instructed by your doctor.   Begin with a light meal and progress to your normal diet. Heavy or fried foods are harder to digest and may make you feel sick to your stomach (nauseated).   Avoid alcoholic beverages for 24 hours or as instructed.    MEDICATIONS  You may resume your normal medications.   WHAT YOU CAN EXPECT TODAY  Some feelings of bloating in the abdomen.   Passage of more gas than usual.   Spotting of blood in your stool or on the toilet paper  .  IF YOU HAD POLYPS REMOVED DURING THE COLONOSCOPY:  Eat a soft diet IF YOU HAVE NAUSEA, BLOATING, ABDOMINAL PAIN, OR VOMITING.    FINDING OUT THE RESULTS  OF YOUR TEST Not all test results are available during your visit. DR. Oneida Alar WILL CALL YOU WITHIN 7 DAYS OF YOUR PROCEDUE WITH YOUR RESULTS. Do not assume everything is normal if you have not heard from DR. Ansleigh Safer IN ONE WEEK, CALL HER OFFICE AT [phone].  SEEK IMMEDIATE MEDICAL ATTENTION AND CALL THE OFFICE: [phone] IF:  You have more than a spotting of blood in your stool.   Your belly is swollen (abdominal distention).   You are nauseated or vomiting.   You have a temperature over 101F.   You have abdominal pain or discomfort that is severe or gets worse throughout the day.  Polyps, Colon  A polyp is extra tissue that grows inside your body. Colon polyps grow in the large intestine. The large intestine, also called the colon, is part of your digestive system. It is a long, hollow tube at the end of your digestive tract where your body makes and stores stool. Most polyps are not dangerous. They are benign. This means they are not cancerous. But over time, some types of polyps can turn into cancer. Polyps that are smaller than a pea are usually not harmful. But larger polyps could someday become or may already be cancerous. To be safe, doctors remove all polyps and test them.   WHO GETS POLYPS? Anyone can get polyps, but certain people are more likely than others. You may have a greater chance of  getting polyps if:  You are over 50.   You have had polyps before.   Someone in your family has had polyps.   Someone in your family has had cancer of the large intestine.   Find out if someone in your family has had polyps. You may also be more likely to get polyps if you:   Eat a lot of fatty foods   Smoke   Drink alcohol   Do not exercise  Eat too much   TREATMENT  The caregiver will remove the polyp during sigmoidoscopy or colonoscopy.  PREVENTION There is not one sure way to prevent polyps. You might be able to lower your risk of getting them if you:  Eat more  fruits and vegetables and less fatty food.   Do not smoke.   Avoid alcohol.   Exercise every day.   Lose weight if you are overweight.   Eating more calcium and folate can also lower your risk of getting polyps. Some foods that are rich in calcium are milk, cheese, and broccoli. Some foods that are rich in folate are chickpeas, kidney beans, and spinach.   High-Fiber Diet A high-fiber diet changes your normal diet to include more whole grains, legumes, fruits, and vegetables. Changes in the diet involve replacing refined carbohydrates with unrefined foods. The calorie level of the diet is essentially unchanged. The Dietary Reference Intake (recommended amount) for adult males is 38 grams per day. For adult females, it is 25 grams per day. Pregnant and lactating women should consume 28 grams of fiber per day. Fiber is the intact part of a plant that is not broken down during digestion. Functional fiber is fiber that has been isolated from the plant to provide a beneficial effect in the body. PURPOSE  Increase stool bulk.   Ease and regulate bowel movements.   Lower cholesterol.  INDICATIONS THAT YOU NEED MORE FIBER  Constipation and hemorrhoids.   Uncomplicated diverticulosis (intestine condition) and irritable bowel syndrome.   Weight management.   As a protective measure against hardening of the arteries (atherosclerosis), diabetes, and cancer.   GUIDELINES FOR INCREASING FIBER IN THE DIET  Start adding fiber to the diet slowly. A gradual increase of about 5 more grams (2 slices of whole-wheat bread, 2 servings of most fruits or vegetables, or 1 bowl of high-fiber cereal) per day is best. Too rapid an increase in fiber may result in constipation, flatulence, and bloating.   Drink enough water and fluids to keep your urine clear or pale yellow. Water, juice, or caffeine-free drinks are recommended. Not drinking enough fluid may cause constipation.   Eat a variety of high-fiber  foods rather than one type of fiber.   Try to increase your intake of fiber through using high-fiber foods rather than fiber pills or supplements that contain small amounts of fiber.   The goal is to change the types of food eaten. Do not supplement your present diet with high-fiber foods, but replace foods in your present diet.  INCLUDE A VARIETY OF FIBER SOURCES  Replace refined and processed grains with whole grains, canned fruits with fresh fruits, and incorporate other fiber sources. White rice, white breads, and most bakery goods contain little or no fiber.   Brown whole-grain rice, buckwheat oats, and many fruits and vegetables are all good sources of fiber. These include: broccoli, Brussels sprouts, cabbage, cauliflower, beets, sweet potatoes, white potatoes (skin on), carrots, tomatoes, eggplant, squash, berries, fresh fruits, and dried fruits.  Cereals appear to be the richest source of fiber. Cereal fiber is found in whole grains and bran. Bran is the fiber-rich outer coat of cereal grain, which is largely removed in refining. In whole-grain cereals, the bran remains. In breakfast cereals, the largest amount of fiber is found in those with "bran" in their names. The fiber content is sometimes indicated on the label.   You may need to include additional fruits and vegetables each day.   In baking, for 1 cup white flour, you may use the following substitutions:   1 cup whole-wheat flour minus 2 tablespoons.   1/2 cup white flour plus 1/2 cup whole-wheat flour.   Diverticulosis Diverticulosis is a common condition that develops when small pouches (diverticula) form in the wall of the colon. The risk of diverticulosis increases with age. It happens more often in people who eat a low-fiber diet. Most individuals with diverticulosis have no symptoms. Those individuals with symptoms usually experience belly (abdominal) pain, constipation, or loose stools (diarrhea).  HOME CARE  INSTRUCTIONS  Increase the amount of fiber in your diet as directed by your caregiver or dietician. This may reduce symptoms of diverticulosis.   Drink at least 6 to 8 glasses of water each day to prevent constipation.   Try not to strain when you have a bowel movement.   Avoiding nuts and seeds to prevent complications is still an uncertain benefit.       FOODS HAVING HIGH FIBER CONTENT INCLUDE:  Fruits. Apple, peach, pear, tangerine, raisins, prunes.   Vegetables. Brussels sprouts, asparagus, broccoli, cabbage, carrot, cauliflower, romaine lettuce, spinach, summer squash, tomato, winter squash, zucchini.   Starchy Vegetables. Baked beans, kidney beans, lima beans, split peas, lentils, potatoes (with skin).   Grains. Whole wheat bread, brown rice, bran flake cereal, plain oatmeal, white rice, shredded wheat, bran muffins.    SEEK IMMEDIATE MEDICAL CARE IF:  You develop increasing pain or severe bloating.   You have an oral temperature above 101F.   You develop vomiting or bowel movements that are bloody or black.   Hemorrhoids Hemorrhoids are dilated (enlarged) veins around the rectum. Sometimes clots will form in the veins. This makes them swollen and painful. These are called thrombosed hemorrhoids. Causes of hemorrhoids include:  Constipation.   Straining to have a bowel movement.   HEAVY LIFTING HOME CARE INSTRUCTIONS  Eat a well balanced diet and drink 6 to 8 glasses of water every day to avoid constipation. You may also use a bulk laxative.   Avoid straining to have bowel movements.   Keep anal area dry and clean.   Do not use a donut shaped pillow or sit on the toilet for long periods. This increases blood pooling and pain.   Move your bowels when your body has the urge; this will require less straining and will decrease pain and pressure.

## 2014-09-16 ENCOUNTER — Encounter (HOSPITAL_COMMUNITY): Payer: Self-pay | Admitting: Gastroenterology

## 2014-09-30 ENCOUNTER — Telehealth: Payer: Self-pay

## 2014-09-30 ENCOUNTER — Telehealth: Payer: Self-pay | Admitting: Gastroenterology

## 2014-09-30 NOTE — Telephone Encounter (Signed)
Tried to call pt. Dr. Oneida Alar has not sent me the report. Could not leave a message.

## 2014-09-30 NOTE — Telephone Encounter (Signed)
Patient returned call please call back at [phone]

## 2014-09-30 NOTE — Telephone Encounter (Signed)
Pt had tcs done on 11/18 by [facility] and was calling to see if the results were back yet. Please call [phone]

## 2014-10-01 NOTE — Telephone Encounter (Signed)
Tried to call pt. See other note of 09/30/2014. Sent message to Dr. Oneida Alar for results.

## 2014-10-09 NOTE — Telephone Encounter (Signed)
Please call pt. HE had A simple adenoma removed. FOLLOW A HIGH FIBER DIET. TCS IN 5 YEARS.

## 2014-10-10 NOTE — Telephone Encounter (Signed)
Reminder in epic °

## 2014-10-11 NOTE — Telephone Encounter (Signed)
Tried to call pt- NA and no voicemail. 

## 2014-10-11 NOTE — Telephone Encounter (Signed)
Pt is aware. Please nic tcs in 5 years.  

## 2014-10-14 NOTE — Telephone Encounter (Signed)
ON RECALL LIST  °

## 2017-02-10 NOTE — Patient Instructions (Signed)
Your procedure is scheduled on: 02/17/2017  Report to [facility] at  740   AM.  Call this number if you have problems the morning of surgery: [phone]   Do not eat food or drink liquids :After Midnight.      Take these medicines the morning of surgery with A SIP OF WATER: lisinopril.   Do not wear jewelry, make-up or nail polish.  Do not wear lotions, powders, or perfumes. You may wear deodorant.  Do not shave 48 hours prior to surgery.  Do not bring valuables to the hospital.  Contacts, dentures or bridgework may not be worn into surgery.  Leave suitcase in the car. After surgery it may be brought to your room.  For patients admitted to the hospital, checkout time is 11:00 AM the day of discharge.   Patients discharged the day of surgery will not be allowed to drive home.  :     Please read over the following fact sheets that you were given: Coughing and Deep Breathing, Surgical Site Infection Prevention, Anesthesia Post-op Instructions and Care and Recovery After Surgery    Cataract A cataract is a clouding of the lens of the eye. When a lens becomes cloudy, vision is reduced based on the degree and nature of the clouding. Many cataracts reduce vision to some degree. Some cataracts make people more near-sighted as they develop. Other cataracts increase glare. Cataracts that are ignored and become worse can sometimes look white. The white color can be seen through the pupil. CAUSES   Aging. However, cataracts may occur at any age, even in newborns.   Certain drugs.   Trauma to the eye.   Certain diseases such as diabetes.   Specific eye diseases such as chronic inflammation inside the eye or a sudden attack of a rare form of glaucoma.   Inherited or acquired medical problems.  SYMPTOMS   Gradual, progressive drop in vision in the affected eye.   Severe, rapid visual loss. This most often happens when trauma is the cause.  DIAGNOSIS  To detect a cataract, an eye doctor  examines the lens. Cataracts are best diagnosed with an exam of the eyes with the pupils enlarged (dilated) by drops.  TREATMENT  For an early cataract, vision may improve by using different eyeglasses or stronger lighting. If that does not help your vision, surgery is the only effective treatment. A cataract needs to be surgically removed when vision loss interferes with your everyday activities, such as driving, reading, or watching TV. A cataract may also have to be removed if it prevents examination or treatment of another eye problem. Surgery removes the cloudy lens and usually replaces it with a substitute lens (intraocular lens, IOL).  At a time when both you and your doctor agree, the cataract will be surgically removed. If you have cataracts in both eyes, only one is usually removed at a time. This allows the operated eye to heal and be out of danger from any possible problems after surgery (such as infection or poor wound healing). In rare cases, a cataract may be doing damage to your eye. In these cases, your caregiver may advise surgical removal right away. The vast majority of people who have cataract surgery have better vision afterward. HOME CARE INSTRUCTIONS  If you are not planning surgery, you may be asked to do the following:  Use different eyeglasses.   Use stronger or brighter lighting.   Ask your eye doctor about reducing your medicine dose  or changing medicines if it is thought that a medicine caused your cataract. Changing medicines does not make the cataract go away on its own.   Become familiar with your surroundings. Poor vision can lead to injury. Avoid bumping into things on the affected side. You are at a higher risk for tripping or falling.   Exercise extreme care when driving or operating machinery.   Wear sunglasses if you are sensitive to bright light or experiencing problems with glare.  SEEK IMMEDIATE MEDICAL CARE IF:   You have a worsening or sudden vision  loss.   You notice redness, swelling, or increasing pain in the eye.   You have a fever.  Document Released: 10/11/2005 Document Revised: 09/30/2011 Document Reviewed: 06/04/2011 [facility] Patient Information 2012 [place].PATIENT INSTRUCTIONS POST-ANESTHESIA  IMMEDIATELY FOLLOWING SURGERY:  Do not drive or operate machinery for the first twenty four hours after surgery.  Do not make any important decisions for twenty four hours after surgery or while taking narcotic pain medications or sedatives.  If you develop intractable nausea and vomiting or a severe headache please notify your doctor immediately.  FOLLOW-UP:  Please make an appointment with your surgeon as instructed. You do not need to follow up with anesthesia unless specifically instructed to do so.  WOUND CARE INSTRUCTIONS (if applicable):  Keep a dry clean dressing on the anesthesia/puncture wound site if there is drainage.  Once the wound has quit draining you may leave it open to air.  Generally you should leave the bandage intact for twenty four hours unless there is drainage.  If the epidural site drains for more than 36-48 hours please call the anesthesia department.  QUESTIONS?:  Please feel free to call your physician or the hospital operator if you have any questions, and they will be happy to assist you.

## 2017-02-14 ENCOUNTER — Encounter (HOSPITAL_COMMUNITY)
Admission: RE | Admit: 2017-02-14 | Discharge: 2017-02-14 | Disposition: A | Discharge status: Home / Self Care | Payer: Medicare HMO | Source: Ambulatory Visit | Attending: Ophthalmology | Admitting: Ophthalmology

## 2017-02-14 ENCOUNTER — Encounter (HOSPITAL_COMMUNITY): Payer: Self-pay

## 2017-02-14 ENCOUNTER — Other Ambulatory Visit: Payer: Self-pay

## 2017-02-14 DIAGNOSIS — I10 Essential (primary) hypertension: Secondary | ICD-10-CM | POA: Diagnosis not present

## 2017-02-14 DIAGNOSIS — N289 Disorder of kidney and ureter, unspecified: Secondary | ICD-10-CM | POA: Diagnosis not present

## 2017-02-14 DIAGNOSIS — H25812 Combined forms of age-related cataract, left eye: Secondary | ICD-10-CM | POA: Diagnosis not present

## 2017-02-14 DIAGNOSIS — Z79899 Other long term (current) drug therapy: Secondary | ICD-10-CM | POA: Diagnosis not present

## 2017-02-14 HISTORY — DX: Malignant (primary) neoplasm, unspecified: C80.1

## 2017-02-14 HISTORY — DX: Personal history of urinary calculi: Z87.442

## 2017-02-14 LAB — BASIC METABOLIC PANEL
Anion gap: 6 (ref 5–15)
BUN: 13 mg/dL (ref 6–20)
CO2: 29 mmol/L (ref 22–32)
CREATININE: 1.2 mg/dL (ref 0.61–1.24)
Calcium: 9.6 mg/dL (ref 8.9–10.3)
Chloride: 100 mmol/L — ABNORMAL LOW (ref 101–111)
GFR calc non Af Amer: 59 mL/min — ABNORMAL LOW (ref >60)
Glucose, Bld: 125 mg/dL — ABNORMAL HIGH (ref 65–99)
Potassium: 3.8 mmol/L (ref 3.5–5.1)
SODIUM: 135 mmol/L (ref 135–145)

## 2017-02-14 LAB — CBC WITH DIFFERENTIAL/PLATELET
BASOS PCT: 1 %
Basophils Absolute: 0 10*3/uL (ref 0.0–0.1)
Eosinophils Absolute: 0.2 10*3/uL (ref 0.0–0.7)
Eosinophils Relative: 6 %
HCT: 43.1 % (ref 39.0–52.0)
Hemoglobin: 14.6 g/dL (ref 13.0–17.0)
Lymphocytes Relative: 22 %
Lymphs Abs: 0.9 10*3/uL (ref 0.7–4.0)
MCH: 30.4 pg (ref 26.0–34.0)
MCHC: 33.9 g/dL (ref 30.0–36.0)
MCV: 89.6 fL (ref 78.0–100.0)
MONO ABS: 0.3 10*3/uL (ref 0.1–1.0)
Monocytes Relative: 8 %
Neutro Abs: 2.6 10*3/uL (ref 1.7–7.7)
Neutrophils Relative %: 63 %
Platelets: 164 10*3/uL (ref 150–400)
RBC: 4.81 MIL/uL (ref 4.22–5.81)
RDW: 12.2 % (ref 11.5–15.5)
WBC: 4 10*3/uL (ref 4.0–10.5)

## 2017-02-17 ENCOUNTER — Encounter (HOSPITAL_COMMUNITY)
Admission: RE | Disposition: A | Discharge status: Home / Self Care | Payer: Self-pay | Source: Ambulatory Visit | Attending: Ophthalmology

## 2017-02-17 ENCOUNTER — Ambulatory Visit (HOSPITAL_COMMUNITY): Payer: Medicare HMO | Admitting: Anesthesiology

## 2017-02-17 ENCOUNTER — Encounter (HOSPITAL_COMMUNITY): Payer: Self-pay | Admitting: *Deleted

## 2017-02-17 ENCOUNTER — Ambulatory Visit (HOSPITAL_COMMUNITY)
Admission: RE | Admit: 2017-02-17 | Discharge: 2017-02-17 | Disposition: A | Discharge status: Home / Self Care | Payer: Medicare HMO | Source: Ambulatory Visit | Attending: Ophthalmology | Admitting: Ophthalmology

## 2017-02-17 DIAGNOSIS — N289 Disorder of kidney and ureter, unspecified: Secondary | ICD-10-CM | POA: Diagnosis not present

## 2017-02-17 DIAGNOSIS — Z79899 Other long term (current) drug therapy: Secondary | ICD-10-CM | POA: Diagnosis not present

## 2017-02-17 DIAGNOSIS — I10 Essential (primary) hypertension: Secondary | ICD-10-CM | POA: Insufficient documentation

## 2017-02-17 DIAGNOSIS — H25812 Combined forms of age-related cataract, left eye: Secondary | ICD-10-CM | POA: Insufficient documentation

## 2017-02-17 HISTORY — PX: CATARACT EXTRACTION W/PHACO: SHX586

## 2017-02-17 SURGERY — PHACOEMULSIFICATION, CATARACT, WITH IOL INSERTION
Anesthesia: Monitor Anesthesia Care | Site: Eye | Laterality: Left

## 2017-02-17 MED ORDER — NEOMYCIN-POLYMYXIN-DEXAMETH 3.5-10000-0.1 OP SUSP
OPHTHALMIC | Status: DC | PRN
  Administered 2017-02-17: 2 [drp] via OPHTHALMIC

## 2017-02-17 MED ORDER — LIDOCAINE HCL (PF) 1 % IJ SOLN
INTRAMUSCULAR | Status: DC | PRN
  Administered 2017-02-17: .8 mL

## 2017-02-17 MED ORDER — PROVISC 10 MG/ML IO SOLN
INTRAOCULAR | Status: DC | PRN
  Administered 2017-02-17: 0.85 mL via INTRAOCULAR

## 2017-02-17 MED ORDER — MIDAZOLAM HCL 2 MG/2ML IJ SOLN
INTRAMUSCULAR | Status: AC
  Filled 2017-02-17: qty 2

## 2017-02-17 MED ORDER — TETRACAINE HCL 0.5 % OP SOLN
1.0000 [drp] | OPHTHALMIC | Status: AC
  Administered 2017-02-17 (×3): 1 [drp] via OPHTHALMIC

## 2017-02-17 MED ORDER — FENTANYL CITRATE (PF) 100 MCG/2ML IJ SOLN
INTRAMUSCULAR | Status: AC
  Filled 2017-02-17: qty 2

## 2017-02-17 MED ORDER — BSS IO SOLN
INTRAOCULAR | Status: DC | PRN
  Administered 2017-02-17: 15 mL via INTRAOCULAR

## 2017-02-17 MED ORDER — LACTATED RINGERS IV SOLN
INTRAVENOUS | Status: DC
  Administered 2017-02-17: 09:00:00 via INTRAVENOUS

## 2017-02-17 MED ORDER — FENTANYL CITRATE (PF) 100 MCG/2ML IJ SOLN
25.0000 ug | Freq: Once | INTRAMUSCULAR | Status: AC
  Administered 2017-02-17: 25 ug via INTRAVENOUS

## 2017-02-17 MED ORDER — LIDOCAINE HCL 3.5 % OP GEL
1.0000 "application " | Freq: Once | OPHTHALMIC | Status: AC
  Administered 2017-02-17: 1 via OPHTHALMIC

## 2017-02-17 MED ORDER — PHENYLEPHRINE HCL 2.5 % OP SOLN
1.0000 [drp] | OPHTHALMIC | Status: AC
  Administered 2017-02-17 (×3): 1 [drp] via OPHTHALMIC

## 2017-02-17 MED ORDER — MIDAZOLAM HCL 2 MG/2ML IJ SOLN
1.0000 mg | INTRAMUSCULAR | Status: AC
  Administered 2017-02-17: 2 mg via INTRAVENOUS

## 2017-02-17 MED ORDER — CYCLOPENTOLATE-PHENYLEPHRINE 0.2-1 % OP SOLN
1.0000 [drp] | OPHTHALMIC | Status: AC
  Administered 2017-02-17 (×3): 1 [drp] via OPHTHALMIC

## 2017-02-17 MED ORDER — POVIDONE-IODINE 5 % OP SOLN
OPHTHALMIC | Status: DC | PRN
  Administered 2017-02-17: 1 via OPHTHALMIC

## 2017-02-17 MED ORDER — EPINEPHRINE PF 1 MG/ML IJ SOLN
INTRAMUSCULAR | Status: DC | PRN
  Administered 2017-02-17: 10:00:00

## 2017-02-17 SURGICAL SUPPLY — 12 items
CLOTH BEACON ORANGE TIMEOUT ST (SAFETY) ×2 IMPLANT
EYE SHIELD UNIVERSAL CLEAR (GAUZE/BANDAGES/DRESSINGS) ×2 IMPLANT
GLOVE BIOGEL PI IND STRL 6.5 (GLOVE) IMPLANT
GLOVE BIOGEL PI IND STRL 7.0 (GLOVE) IMPLANT
GLOVE BIOGEL PI IND STRL 8 (GLOVE) IMPLANT
GLOVE BIOGEL PI INDICATOR 6.5 (GLOVE) ×2
GLOVE BIOGEL PI INDICATOR 7.0 (GLOVE) ×2
GLOVE BIOGEL PI INDICATOR 8 (GLOVE) ×2
PAD ARMBOARD 7.5X6 YLW CONV (MISCELLANEOUS) ×2 IMPLANT
SIGHTPATH CAT PROC W REG LENS (Ophthalmic Related) ×3 IMPLANT
SYRINGE LUER LOK 1CC (MISCELLANEOUS) ×2 IMPLANT
WATER STERILE IRR 250ML POUR (IV SOLUTION) ×2 IMPLANT

## 2017-02-17 NOTE — Transfer of Care (Signed)
Immediate Anesthesia Transfer of Care Note  Patient: Scott Oliver  Procedure(s) Performed: Procedure(s) with comments: CATARACT EXTRACTION PHACO AND INTRAOCULAR LENS PLACEMENT (IOC) CDE - 9.17  (Left) - left  Patient Location: Short Stay  Anesthesia Type:MAC  Level of Consciousness: awake  Airway & Oxygen Therapy: Patient Spontanous Breathing  Post-op Assessment: Report given to RN  Post vital signs: Reviewed and stable  Last Vitals:  Vitals:   02/17/17 0900  BP: 133/64  Pulse: (!) 58  Resp: 18    Last Pain:  Vitals:   02/17/17 0900  TempSrc: Oral      Patients Stated Pain Goal: 5 (68/34/19 6222)  Complications: No apparent anesthesia complications

## 2017-02-17 NOTE — Consult Note (Signed)
I have reviewed the H&P, the patient was re-examined, and I have identified no interval changes in medical condition and plan of care since the history and physical of record  

## 2017-02-17 NOTE — Anesthesia Preprocedure Evaluation (Signed)
Anesthesia Evaluation  Patient identified by MRN, date of birth, ID band Patient awake    Reviewed: Allergy & Precautions, NPO status , Patient's Chart, lab work & pertinent test results  Airway Mallampati: I  TM Distance: >3 FB     Dental  (+) Poor Dentition   Pulmonary neg pulmonary ROS,    breath sounds clear to auscultation       Cardiovascular hypertension, Pt. on medications  Rhythm:Regular Rate:Normal     Neuro/Psych    GI/Hepatic   Endo/Other    Renal/GU Renal disease     Musculoskeletal   Abdominal   Peds  Hematology   Anesthesia Other Findings   Reproductive/Obstetrics                             Anesthesia Physical Anesthesia Plan  ASA: II  Anesthesia Plan: MAC   Post-op Pain Management:    Induction: Intravenous  Airway Management Planned: Nasal Cannula  Additional Equipment:   Intra-op Plan:   Post-operative Plan:   Informed Consent: I have reviewed the patients History and Physical, chart, labs and discussed the procedure including the risks, benefits and alternatives for the proposed anesthesia with the patient or authorized representative who has indicated his/her understanding and acceptance.     Plan Discussed with:   Anesthesia Plan Comments:         Anesthesia Quick Evaluation

## 2017-02-17 NOTE — Discharge Instructions (Signed)

## 2017-02-17 NOTE — Op Note (Signed)
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  Assistants: None  Anesthesia: Topical with MAC  Indications: Painless, progressive loss of vision with compromise of daily activities.  Surgery: Cataract Extraction with Intraocular lens Implant Left Eye  Discription: The patient had dilating drops and viscous lidocaine placed into the Left eye in the pre-op holding area. After transfer to the operating room, a time out was performed. The patient was then prepped and draped. Beginning with a 31 degree blade a paracentesis port was made at the surgeon's 2 o'clock position. The anterior chamber was then filled with 1% non-preserved lidocaine. This was followed by filling the anterior chamber with Provisc.  A 2.58m keratome blade was used to make a clear corneal incision at the temporal limbus.  A bent cystatome needle was used to create a continuous tear capsulotomy. Hydrodissection was performed with balanced salt solution on a Fine canula. The lens nucleus was then removed using the phacoemulsification handpiece. Residual cortex was removed with the I&A handpiece. The anterior chamber and capsular bag were refilled with Provisc. A posterior chamber intraocular lens was placed into the capsular bag with it's injector. The implant was positioned with the Kuglan hook. The Provisc was then removed from the anterior chamber and capsular bag with the I&A handpiece. Stromal hydration of the main incision and paracentesis port was performed with BSS on a Fine canula. The wounds were tested for leak which was negative. The patient tolerated the procedure well. There were no operative complications. The patient was then transferred to the recovery room in stable condition.  Complications: None  Specimen: None  EBL: None  Prosthetic device: Abbott Technis, PCB00, power  18.5, SN [id number]

## 2017-02-17 NOTE — Anesthesia Postprocedure Evaluation (Signed)
Anesthesia Post Note  Patient: Scott Oliver  Procedure(s) Performed: Procedure(s) (LRB): CATARACT EXTRACTION PHACO AND INTRAOCULAR LENS PLACEMENT (IOC) CDE - 9.17  (Left)  Patient location during evaluation: Short Stay Anesthesia Type: MAC Level of consciousness: awake and alert, oriented and patient cooperative Pain management: pain level controlled Vital Signs Assessment: post-procedure vital signs reviewed and stable Respiratory status: spontaneous breathing, nonlabored ventilation and respiratory function stable Cardiovascular status: blood pressure returned to baseline Postop Assessment: no signs of nausea or vomiting Anesthetic complications: no     Last Vitals:  Vitals:   02/17/17 0900  BP: 133/64  Pulse: (!) 58  Resp: 18    Last Pain:  Vitals:   02/17/17 0900  TempSrc: Oral                 Domenick Quebedeaux J

## 2017-02-21 ENCOUNTER — Encounter (HOSPITAL_COMMUNITY): Payer: Self-pay | Admitting: Ophthalmology

## 2017-03-16 ENCOUNTER — Encounter (HOSPITAL_COMMUNITY): Payer: Self-pay

## 2017-03-16 NOTE — Patient Instructions (Signed)
Scott Oliver  03/16/2017     @PREFPERIOPPHARMACY @   Your procedure is scheduled on 03/24/2017.  Report to [facility] at 8:10 A.M.  Call this number if you have problems the morning of surgery:  [phone]   Remember:  Do not eat food or drink liquids after midnight.  Take these medicines the morning of surgery with A SIP OF WATER : Lisinopril   Do not wear jewelry, make-up or nail polish.  Do not wear lotions, powders, or perfumes, or deoderant.  Do not shave 48 hours prior to surgery.  Men may shave face and neck.  Do not bring valuables to the hospital.  [facility] is not responsible for any belongings or valuables.  Contacts, dentures or bridgework may not be worn into surgery.  Leave your suitcase in the car.  After surgery it may be brought to your room.  For patients admitted to the hospital, discharge time will be determined by your treatment team.  Patients discharged the day of surgery will not be allowed to drive home.   Name and phone number of your driver:   family Special instructions:  n/a  Please read over the following fact sheets that you were given. Care and Recovery After Surgery  Cataract Surgery Cataract surgery is a procedure to remove a cataract from your eye. A cataract is cloudiness on the lens of your eye. The lens focuses light inside the eye. When a lens becomes cloudy, your vision is affected. Cataract surgery is a procedure to remove the cloudy lens. A substitute lens (intraocular lens or IOL) is usually inserted as a replacement for the cloudy lens. Tell a health care provider about:  Any allergies you have.  All medicines you are taking, including vitamins, herbs, eye drops, creams, and over-the-counter medicines.  Any problems you or family members have had with anesthetic medicines.  Any blood disorders you have.  Any surgeries you have had, especially eye surgeries that include refractive surgery, such as PRK and LASIK.  Any  medical conditions you have.  Whether you are pregnant or may be pregnant. What are the risks? Generally, this is a safe procedure. However, problems may occur, including:  Infection.  Bleeding.  Glaucoma.  Retinal detachment.  Allergic reactions to medicines.  Damage to other structures or organs.  Inflammation of the eye.  Clouding of the part of your eye that holds an IOL in place (after-cataract), if an IOL was inserted. This is fairly common.  An IOL moving out of position, if an IOL was inserted. This is very rare.  Loss of vision. This is rare. What happens before the procedure?  Follow instructions from your health care provider about eating or drinking restrictions.  Ask your health care provider about:  Changing or stopping your regular medicines, including any eye drops you have been prescribed. This is especially important if you are taking diabetes medicines or blood thinners.  Taking medicines such as aspirin and ibuprofen. These medicines can thin your blood. Do not take these medicines before your procedure if your health care provider instructs you not to.  Do not put contact lenses in either eye on the day of your surgery.  Plan for someone to drive you to and from the procedure.  If you will be going home right after the procedure, plan to have someone with you for 24 hours. What happens during the procedure?  An IV tube may be inserted into one of your veins.  You  will be given one or more of the following:  A medicine to help you relax (sedative).  A medicine to numb the area (local anesthetic). This may be numbing eye drops or an injection that is given behind the eye.  A small cut (incision) will be made to the edge of the clear, dome-shaped surface that covers the front of the eye (cornea).  A small probe will be inserted into the eye. This device gives off ultrasound waves that soften and break up the cloudy center of the lens. This makes  it easier for the cloudy lens to be removed by suction.  An IOL may be implanted.  Part of the capsule that surrounds the lens will be left in the eye to support the IOL.  Your surgeon may use stitches (sutures) to close the incision. The procedure may vary among health care providers and hospitals. What happens after the procedure?  Your blood pressure, heart rate, breathing rate, and blood oxygen level will be monitored often until the medicines you were given have worn off.  You may be given a protective shield to wear over your eyes.  Do not drive for 24 hours if you received a sedative. This information is not intended to replace advice given to you by your health care provider. Make sure you discuss any questions you have with your health care provider. Document Released: 09/30/2011 Document Revised: 03/18/2016 Document Reviewed: 08/21/2015 Elsevier Interactive Patient Education  2017 [employer].

## 2017-03-18 ENCOUNTER — Encounter (HOSPITAL_COMMUNITY)
Admission: RE | Admit: 2017-03-18 | Discharge: 2017-03-18 | Disposition: A | Discharge status: Home / Self Care | Payer: Medicare HMO | Source: Ambulatory Visit | Attending: Ophthalmology | Admitting: Ophthalmology

## 2017-03-18 ENCOUNTER — Encounter (HOSPITAL_COMMUNITY): Payer: Self-pay

## 2017-03-24 ENCOUNTER — Ambulatory Visit (HOSPITAL_COMMUNITY): Payer: Medicare HMO | Admitting: Anesthesiology

## 2017-03-24 ENCOUNTER — Encounter (HOSPITAL_COMMUNITY)
Admission: RE | Disposition: A | Discharge status: Home / Self Care | Payer: Self-pay | Source: Ambulatory Visit | Attending: Ophthalmology

## 2017-03-24 ENCOUNTER — Ambulatory Visit (HOSPITAL_COMMUNITY)
Admission: RE | Admit: 2017-03-24 | Discharge: 2017-03-24 | Disposition: A | Discharge status: Home / Self Care | Payer: Medicare HMO | Source: Ambulatory Visit | Attending: Ophthalmology | Admitting: Ophthalmology

## 2017-03-24 ENCOUNTER — Encounter (HOSPITAL_COMMUNITY): Payer: Self-pay | Admitting: Ophthalmology

## 2017-03-24 DIAGNOSIS — Z79899 Other long term (current) drug therapy: Secondary | ICD-10-CM | POA: Insufficient documentation

## 2017-03-24 DIAGNOSIS — H25811 Combined forms of age-related cataract, right eye: Secondary | ICD-10-CM | POA: Insufficient documentation

## 2017-03-24 DIAGNOSIS — I10 Essential (primary) hypertension: Secondary | ICD-10-CM | POA: Insufficient documentation

## 2017-03-24 HISTORY — PX: CATARACT EXTRACTION W/PHACO: SHX586

## 2017-03-24 SURGERY — PHACOEMULSIFICATION, CATARACT, WITH IOL INSERTION
Anesthesia: Monitor Anesthesia Care | Site: Eye | Laterality: Right

## 2017-03-24 MED ORDER — POVIDONE-IODINE 5 % OP SOLN
OPHTHALMIC | Status: DC | PRN
  Administered 2017-03-24: 1 via OPHTHALMIC

## 2017-03-24 MED ORDER — MIDAZOLAM HCL 2 MG/2ML IJ SOLN
INTRAMUSCULAR | Status: AC
  Filled 2017-03-24: qty 2

## 2017-03-24 MED ORDER — PROVISC 10 MG/ML IO SOLN
INTRAOCULAR | Status: DC | PRN
  Administered 2017-03-24: 0.85 mL via INTRAOCULAR

## 2017-03-24 MED ORDER — LIDOCAINE HCL 3.5 % OP GEL
1.0000 "application " | Freq: Once | OPHTHALMIC | Status: AC
  Administered 2017-03-24: 1 via OPHTHALMIC

## 2017-03-24 MED ORDER — LACTATED RINGERS IV SOLN
INTRAVENOUS | Status: DC
  Administered 2017-03-24: 09:00:00 via INTRAVENOUS

## 2017-03-24 MED ORDER — CYCLOPENTOLATE-PHENYLEPHRINE 0.2-1 % OP SOLN
1.0000 [drp] | OPHTHALMIC | Status: AC
  Administered 2017-03-24 (×3): 1 [drp] via OPHTHALMIC

## 2017-03-24 MED ORDER — LIDOCAINE HCL (PF) 1 % IJ SOLN
INTRAMUSCULAR | Status: DC | PRN
  Administered 2017-03-24: .7 mL

## 2017-03-24 MED ORDER — PHENYLEPHRINE HCL 2.5 % OP SOLN
1.0000 [drp] | OPHTHALMIC | Status: AC
  Administered 2017-03-24 (×3): 1 [drp] via OPHTHALMIC

## 2017-03-24 MED ORDER — FENTANYL CITRATE (PF) 100 MCG/2ML IJ SOLN
25.0000 ug | Freq: Once | INTRAMUSCULAR | Status: AC
  Administered 2017-03-24: 25 ug via INTRAVENOUS

## 2017-03-24 MED ORDER — EPINEPHRINE PF 1 MG/ML IJ SOLN
INTRAOCULAR | Status: DC | PRN
  Administered 2017-03-24: 500 mL

## 2017-03-24 MED ORDER — EPINEPHRINE PF 1 MG/ML IJ SOLN
INTRAMUSCULAR | Status: AC
  Filled 2017-03-24: qty 1

## 2017-03-24 MED ORDER — FENTANYL CITRATE (PF) 100 MCG/2ML IJ SOLN
INTRAMUSCULAR | Status: AC
  Filled 2017-03-24: qty 2

## 2017-03-24 MED ORDER — BSS IO SOLN
INTRAOCULAR | Status: DC | PRN
  Administered 2017-03-24: 15 mL

## 2017-03-24 MED ORDER — TETRACAINE HCL 0.5 % OP SOLN
1.0000 [drp] | OPHTHALMIC | Status: AC
  Administered 2017-03-24 (×3): 1 [drp] via OPHTHALMIC

## 2017-03-24 MED ORDER — MIDAZOLAM HCL 2 MG/2ML IJ SOLN
1.0000 mg | INTRAMUSCULAR | Status: AC
  Administered 2017-03-24: 2 mg via INTRAVENOUS

## 2017-03-24 MED ORDER — NEOMYCIN-POLYMYXIN-DEXAMETH 3.5-10000-0.1 OP SUSP
OPHTHALMIC | Status: DC | PRN
  Administered 2017-03-24: 2 [drp] via OPHTHALMIC

## 2017-03-24 MED ORDER — GATIFLOXACIN 0.5 % OP SOLN: 1.0000 [drp] | Freq: Three times a day (TID) | OPHTHALMIC | 0 refills | Status: AC

## 2017-03-24 MED ORDER — BROMFENAC SODIUM 0.09 % OP SOLN: 1.0000 [drp] | Freq: Every morning | OPHTHALMIC | 0 refills | Status: AC

## 2017-03-24 SURGICAL SUPPLY — 12 items

## 2017-03-24 NOTE — Discharge Instructions (Signed)

## 2017-03-24 NOTE — Op Note (Signed)
Date of Admission: 03/24/2017  Date of Surgery: 03/24/2017  Pre-Op Dx: Cataract Right  Eye  Post-Op Dx: Senile Combined Cataract  Right  Eye,  Dx Code L97.471  Surgeon: Tonny Branch, M.D.  Assistants: None  Anesthesia: Topical with MAC  Indications: Painless, progressive loss of vision with compromise of daily activities.  Surgery: Cataract Extraction with Intraocular lens Implant Right Eye  Discription: The patient had dilating drops and viscous lidocaine placed into the Right eye in the pre-op holding area. After transfer to the operating room, a time out was performed. The patient was then prepped and draped. Beginning with a 30m blade a paracentesis port was made at the surgeon's 2 o'clock position. The anterior chamber was then filled with 1% non-preserved lidocaine. This was followed by filling the anterior chamber with Provisc.  A 2.457mkeratome blade was used to make a clear corneal incision at the temporal limbus.  A bent cystatome needle was used to create a continuous tear capsulotomy. Hydrodissection was performed with balanced salt solution on a Fine canula. The lens nucleus was then removed using the phacoemulsification handpiece. Residual cortex was removed with the I&A handpiece. The anterior chamber and capsular bag were refilled with Provisc. A posterior chamber intraocular lens was placed into the capsular bag with it's injector. The implant was positioned with the Kuglan hook. The Provisc was then removed from the anterior chamber and capsular bag with the I&A handpiece. Stromal hydration of the main incision and paracentesis port was performed with BSS on a Fine canula. The wounds were tested for leak which was negative. The patient tolerated the procedure well. There were no operative complications. The patient was then transferred to the recovery room in stable condition.  Complications: None  Specimen: None  EBL: None  Prosthetic device: Abbott Technis, PCB00, power  18.5, SN [id number]

## 2017-03-24 NOTE — H&P (Signed)
I have reviewed the H&P, the patient was re-examined, and I have identified no interval changes in medical condition and plan of care since the history and physical of record  

## 2017-03-24 NOTE — Anesthesia Preprocedure Evaluation (Signed)
Anesthesia Evaluation  Patient identified by MRN, date of birth, ID band Patient awake    Reviewed: Allergy & Precautions, NPO status , Patient's Chart, lab work & pertinent test results  Airway Mallampati: I  TM Distance: >3 FB     Dental  (+) Poor Dentition   Pulmonary neg pulmonary ROS,    breath sounds clear to auscultation       Cardiovascular hypertension, Pt. on medications  Rhythm:Regular Rate:Normal     Neuro/Psych    GI/Hepatic   Endo/Other    Renal/GU Renal disease     Musculoskeletal   Abdominal   Peds  Hematology   Anesthesia Other Findings   Reproductive/Obstetrics                             Anesthesia Physical Anesthesia Plan  ASA: II  Anesthesia Plan: MAC   Post-op Pain Management:    Induction: Intravenous  Airway Management Planned: Nasal Cannula  Additional Equipment:   Intra-op Plan:   Post-operative Plan:   Informed Consent: I have reviewed the patients History and Physical, chart, labs and discussed the procedure including the risks, benefits and alternatives for the proposed anesthesia with the patient or authorized representative who has indicated his/her understanding and acceptance.     Plan Discussed with:   Anesthesia Plan Comments:         Anesthesia Quick Evaluation

## 2017-03-24 NOTE — Transfer of Care (Signed)
Immediate Anesthesia Transfer of Care Note  Patient: Scott Oliver  Procedure(s) Performed: Procedure(s) with comments: CATARACT EXTRACTION PHACO AND INTRAOCULAR LENS PLACEMENT RIGHT EYE CDE=12.03 (Right) - right  Patient Location: PACU  Anesthesia Type:MAC  Level of Consciousness: awake, alert , oriented and patient cooperative  Airway & Oxygen Therapy: Patient Spontanous Breathing  Post-op Assessment: Report given to RN and Post -op Vital signs reviewed and stable  Post vital signs: Reviewed and stable  Last Vitals:  Vitals:   03/24/17 0834  BP: 127/67  Pulse: 63  Resp: 16  Temp: 36.6 C    Last Pain:  Vitals:   03/24/17 0834  TempSrc: Oral      Patients Stated Pain Goal: 6 (74/[id number]/49 [id number])  Complications: No apparent anesthesia complications

## 2017-03-24 NOTE — Anesthesia Postprocedure Evaluation (Signed)
Anesthesia Post Note  Patient: Scott Oliver  Procedure(s) Performed: Procedure(s) (LRB): CATARACT EXTRACTION PHACO AND INTRAOCULAR LENS PLACEMENT RIGHT EYE CDE=12.03 (Right)  Patient location during evaluation: PACU Anesthesia Type: MAC Level of consciousness: awake, awake and alert, oriented and patient cooperative Pain management: pain level controlled Vital Signs Assessment: post-procedure vital signs reviewed and stable Respiratory status: spontaneous breathing, nonlabored ventilation and respiratory function stable Cardiovascular status: stable Postop Assessment: no signs of nausea or vomiting Anesthetic complications: no     Last Vitals:  Vitals:   03/24/17 0834  BP: 127/67  Pulse: 63  Resp: 16  Temp: 36.6 C    Last Pain:  Vitals:   03/24/17 0834  TempSrc: Oral                 Maribeth Jiles L

## 2017-03-25 ENCOUNTER — Encounter (HOSPITAL_COMMUNITY): Payer: Self-pay | Admitting: Ophthalmology

## 2017-12-07 NOTE — Progress Notes (Signed)
REVIEWED-NO ADDITIONAL RECOMMENDATIONS. 

## 2019-06-27 ENCOUNTER — Encounter: Payer: Self-pay | Admitting: Gastroenterology

## 2019-12-25 ENCOUNTER — Ambulatory Visit: Payer: Medicare HMO | Attending: Internal Medicine

## 2019-12-25 DIAGNOSIS — Z23 Encounter for immunization: Secondary | ICD-10-CM | POA: Insufficient documentation

## 2019-12-25 NOTE — Progress Notes (Signed)
   Covid-19 Vaccination Clinic  Name:  Scott Oliver    MRN: [id number] DOB: 03-09-1946  12/25/2019  Mr. Scott Oliver was observed post Covid-19 immunization for 15 minutes without incident. He was provided with Vaccine Information Sheet and instruction to access the V-Safe system.   Mr. Scott Oliver was instructed to call 911 with any severe reactions post vaccine: Marland Kitchen Difficulty breathing  . Swelling of face and throat  . A fast heartbeat  . A bad rash all over body  . Dizziness and weakness   Immunizations Administered    Name Date Dose VIS Date Route   Moderna COVID-19 Vaccine 12/25/2019  3:01 PM 0.5 mL 09/25/2019 Intramuscular   Manufacturer: Moderna   Lot: [id number]   [id number]

## 2020-01-22 ENCOUNTER — Ambulatory Visit: Payer: Medicare HMO | Attending: Internal Medicine

## 2020-01-22 DIAGNOSIS — Z23 Encounter for immunization: Secondary | ICD-10-CM

## 2020-01-22 NOTE — Progress Notes (Signed)
   Covid-19 Vaccination Clinic  Name:  CONRADO KROTZER    MRN: [id number] DOB: Jan 10, 1946  01/22/2020  Mr. Schacher was observed post Covid-19 immunization for 15 minutes without incident. He was provided with Vaccine Information Sheet and instruction to access the V-Safe system.   Mr. Dyck was instructed to call 911 with any severe reactions post vaccine: Marland Kitchen Difficulty breathing  . Swelling of face and throat  . A fast heartbeat  . A bad rash all over body  . Dizziness and weakness   Immunizations Administered    Name Date Dose VIS Date Route   Moderna COVID-19 Vaccine 01/22/2020 12:08 PM 0.5 mL 09/25/2019 Intramuscular   Manufacturer: Moderna   Lot: [id number]   [place][id number]

## 2020-06-11 ENCOUNTER — Telehealth: Payer: Self-pay | Admitting: Internal Medicine

## 2020-06-11 ENCOUNTER — Emergency Department (HOSPITAL_COMMUNITY)
Admission: EM | Admit: 2020-06-11 | Discharge: 2020-06-11 | Disposition: A | Discharge status: Home / Self Care | Payer: Medicare HMO | Attending: Emergency Medicine | Admitting: Emergency Medicine

## 2020-06-11 ENCOUNTER — Other Ambulatory Visit: Payer: Self-pay

## 2020-06-11 ENCOUNTER — Encounter (HOSPITAL_COMMUNITY): Payer: Self-pay | Admitting: Emergency Medicine

## 2020-06-11 DIAGNOSIS — E860 Dehydration: Secondary | ICD-10-CM | POA: Insufficient documentation

## 2020-06-11 DIAGNOSIS — Z85038 Personal history of other malignant neoplasm of large intestine: Secondary | ICD-10-CM | POA: Diagnosis not present

## 2020-06-11 DIAGNOSIS — E876 Hypokalemia: Secondary | ICD-10-CM | POA: Diagnosis not present

## 2020-06-11 DIAGNOSIS — Z79899 Other long term (current) drug therapy: Secondary | ICD-10-CM | POA: Insufficient documentation

## 2020-06-11 DIAGNOSIS — Z7982 Long term (current) use of aspirin: Secondary | ICD-10-CM | POA: Diagnosis not present

## 2020-06-11 DIAGNOSIS — K5900 Constipation, unspecified: Secondary | ICD-10-CM | POA: Diagnosis not present

## 2020-06-11 DIAGNOSIS — I10 Essential (primary) hypertension: Secondary | ICD-10-CM | POA: Diagnosis not present

## 2020-06-11 DIAGNOSIS — R42 Dizziness and giddiness: Secondary | ICD-10-CM | POA: Insufficient documentation

## 2020-06-11 DIAGNOSIS — R55 Syncope and collapse: Secondary | ICD-10-CM | POA: Diagnosis present

## 2020-06-11 LAB — URINALYSIS, ROUTINE W REFLEX MICROSCOPIC
Bacteria, UA: NONE SEEN
Bilirubin Urine: NEGATIVE
Glucose, UA: 50 mg/dL — AB
Hgb urine dipstick: NEGATIVE
Ketones, ur: NEGATIVE mg/dL
Leukocytes,Ua: NEGATIVE
Nitrite: NEGATIVE
Protein, ur: 30 mg/dL — AB
Specific Gravity, Urine: 1.017 (ref 1.005–1.030)
pH: 5 (ref 5.0–8.0)

## 2020-06-11 LAB — CBC
HCT: 45.4 % (ref 39.0–52.0)
Hemoglobin: 15.1 g/dL (ref 13.0–17.0)
MCH: 31.3 pg (ref 26.0–34.0)
MCHC: 33.3 g/dL (ref 30.0–36.0)
MCV: 94 fL (ref 80.0–100.0)
Platelets: 207 10*3/uL (ref 150–400)
RBC: 4.83 MIL/uL (ref 4.22–5.81)
RDW: 11.9 % (ref 11.5–15.5)
WBC: 8.8 10*3/uL (ref 4.0–10.5)
nRBC: 0 % (ref 0.0–0.2)

## 2020-06-11 LAB — BASIC METABOLIC PANEL
Anion gap: 10 (ref 5–15)
BUN: 15 mg/dL (ref 8–23)
CO2: 25 mmol/L (ref 22–32)
Calcium: 10 mg/dL (ref 8.9–10.3)
Chloride: 100 mmol/L (ref 98–111)
Creatinine, Ser: 1.48 mg/dL — ABNORMAL HIGH (ref 0.61–1.24)
GFR calc Af Amer: 53 mL/min — ABNORMAL LOW (ref >60)
GFR calc non Af Amer: 46 mL/min — ABNORMAL LOW (ref >60)
Glucose, Bld: 144 mg/dL — ABNORMAL HIGH (ref 70–99)
Potassium: 3.1 mmol/L — ABNORMAL LOW (ref 3.5–5.1)
Sodium: 135 mmol/L (ref 135–145)

## 2020-06-11 LAB — CBG MONITORING, ED: Glucose-Capillary: 136 mg/dL — ABNORMAL HIGH (ref 70–99)

## 2020-06-11 MED ORDER — SODIUM CHLORIDE 0.9 % IV BOLUS
1000.0000 mL | Freq: Once | INTRAVENOUS | Status: AC
  Administered 2020-06-11: 1000 mL via INTRAVENOUS

## 2020-06-11 MED ORDER — POTASSIUM CHLORIDE CRYS ER 20 MEQ PO TBCR
20.0000 meq | EXTENDED_RELEASE_TABLET | Freq: Once | ORAL | Status: AC
  Administered 2020-06-11: 20 meq via ORAL
  Filled 2020-06-11: qty 1

## 2020-06-11 NOTE — Telephone Encounter (Signed)
noted 

## 2020-06-11 NOTE — Telephone Encounter (Signed)
Noted.  Called pt back and informed him that since we haven't seen him since 2015, we could not give him medical advise per Neil Crouch, PA.  Pt was advised to reach out to his PCP today but declined.  He said that his daughter is taking him to the ER to be checked out.  Pt moved up his appointment with [place] to 06/16/2020 at 11:00.

## 2020-06-11 NOTE — ED Notes (Signed)
Pt given urinal and encouraged to give a specimen when able

## 2020-06-11 NOTE — ED Triage Notes (Signed)
Pt state he was in line at [employer] today and suddenly passed out.  Pt states another customer was standing behind him and he did not hit his head. Reports he took some laxatives yesterday and stayed up most of the night having bowel movement. Denies any pain.

## 2020-06-11 NOTE — Telephone Encounter (Addendum)
We have not seen patient since 2015, therefore cannot give medical advise. I would recommend he reach out to his PCP TODAY given that he is feeling weak and passed out.  We can offer an appointment for GI concerns.

## 2020-06-11 NOTE — ED Provider Notes (Signed)
[facility] EMERGENCY DEPARTMENT Provider Note   CSN: [id number] Arrival date & time: 06/11/20  1353     History Chief Complaint  Patient presents with  . Loss of Consciousness    Scott Oliver is a 74 y.o. male.  He has a history of hypertension colon cancer.  He said he was at [facility] today standing in line when he low once felt lightheaded and he passed out.  Another customer caught him and he did not strike his head.  He feels back to baseline now.  He said he has been constipated for the last 2 days and took some laxative last night and was up most of the night having bowel movements and watery stools.  He thinks is why you might of passed today.  No fevers or chills nausea vomiting.  No chest pain or shortness of breath.  No recent medication changes.  The history is provided by the patient.  Loss of Consciousness Episode history:  Single Most recent episode:  Today Progression:  Resolved Chronicity:  New Context: normal activity   Witnessed: yes   Relieved by:  Lying down Worsened by:  Nothing Ineffective treatments:  None tried Associated symptoms: dizziness   Associated symptoms: no chest pain, no diaphoresis, no difficulty breathing, no fever, no nausea, no recent surgery, no shortness of breath and no vomiting        Past Medical History:  Diagnosis Date  . Cancer [facility])    right hemicolectomy  . Cholelithiasis   . History of kidney stones   . HTN (hypertension)   . Hyperlipemia   . Microscopic hematuria     Patient Active Problem List   Diagnosis Date Noted  . Malignant neoplasm of colon ([place]) 06/17/2010    Past Surgical History:  Procedure Laterality Date  . CATARACT EXTRACTION W/PHACO Left 02/17/2017   Procedure: CATARACT EXTRACTION PHACO AND INTRAOCULAR LENS PLACEMENT (IOC) CDE - 9.17 ;  Surgeon: Tonny Branch, MD;  Location: [facility] ORS;  Service: Ophthalmology;  Laterality: Left;  left  . CATARACT EXTRACTION W/PHACO Right 03/24/2017   Procedure: CATARACT  EXTRACTION PHACO AND INTRAOCULAR LENS PLACEMENT RIGHT EYE CDE=12.03;  Surgeon: Tonny Branch, MD;  Location: [facility] ORS;  Service: Ophthalmology;  Laterality: Right;  right  . COLON SURGERY  NOV 2011 [facility]   R HEMI  . COLONOSCOPY  AUG 2011 [facility]   HF POLYP W/ FOCI OF ADENO CA, SIMPLE ADENOMAS  . COLONOSCOPY  08/09/2011   Dr. Oneida Alar. rare diverticula sigmiod colon, moderate internal hemorrhoids  . COLONOSCOPY N/A 09/11/2014   Procedure: COLONOSCOPY;  Surgeon: Danie Binder, MD;  Location: [facility] ENDO SUITE;  Service: Endoscopy;  Laterality: N/A;  54       Family History  Problem Relation Age of Onset  . Colon cancer Neg Hx   . Colon polyps Neg Hx   . Anesthesia problems Neg Hx   . Hypotension Neg Hx   . Malignant hyperthermia Neg Hx   . Pseudochol deficiency Neg Hx     Social History   Tobacco Use  . Smoking status: Never Smoker  . Smokeless tobacco: Never Used  Substance Use Topics  . Alcohol use: Yes    Comment: ocassional  . Drug use: No    Home Medications Prior to Admission medications   Medication Sig Start Date End Date Taking? Authorizing Provider  aspirin EC 81 MG tablet Take 81 mg by mouth daily.    [provider]  lisinopril-hydrochlorothiazide (PRINZIDE,ZESTORETIC) 20-12.5 MG per  tablet Take 0.5 tablets by mouth daily.     [provider]  pravastatin (PRAVACHOL) 80 MG tablet Take 40 mg by mouth every evening.    [provider]    Allergies    Patient has no known allergies.  Review of Systems   Review of Systems  Constitutional: Negative for diaphoresis and fever.  HENT: Negative for sore throat.   Eyes: Negative for visual disturbance.  Respiratory: Negative for shortness of breath.   Cardiovascular: Positive for syncope. Negative for chest pain.  Gastrointestinal: Positive for constipation. Negative for abdominal pain, nausea and vomiting.  Genitourinary: Negative for dysuria.  Musculoskeletal: Negative for back pain.  Skin:  Negative for rash.  Neurological: Positive for dizziness, syncope and light-headedness.    Physical Exam Updated Vital Signs BP 108/64 (BP Location: Right Arm)   Pulse (!) 117   Temp 98.5 F (36.9 C) (Oral)   Resp 20   Ht 6\' 2"  (1.88 m)   Wt 91.6 kg   SpO2 100%   BMI 25.94 kg/m   Physical Exam Vitals and nursing note reviewed.  Constitutional:      Appearance: Normal appearance. He is well-developed.  HENT:     Head: Normocephalic and atraumatic.  Eyes:     Conjunctiva/sclera: Conjunctivae normal.  Cardiovascular:     Rate and Rhythm: Normal rate and regular rhythm.     Heart sounds: No murmur heard.   Pulmonary:     Effort: Pulmonary effort is normal. No respiratory distress.     Breath sounds: Normal breath sounds.  Abdominal:     Palpations: Abdomen is soft.     Tenderness: There is no abdominal tenderness.  Musculoskeletal:        General: No deformity or signs of injury. Normal range of motion.     Cervical back: Neck supple.  Skin:    General: Skin is warm and dry.     Capillary Refill: Capillary refill takes less than 2 seconds.  Neurological:     General: No focal deficit present.     Mental Status: He is alert.     Sensory: No sensory deficit.     Motor: No weakness.     Gait: Gait normal.     ED Results / Procedures / Treatments   Labs (all labs ordered are listed, but only abnormal results are displayed) Labs Reviewed  BASIC METABOLIC PANEL - Abnormal; Notable for the following components:      Result Value   Potassium 3.1 (*)    Glucose, Bld 144 (*)    Creatinine, Ser 1.48 (*)    GFR calc non Af Amer 46 (*)    GFR calc Af Amer 53 (*)    All other components within normal limits  URINALYSIS, ROUTINE W REFLEX MICROSCOPIC - Abnormal; Notable for the following components:   Glucose, UA 50 (*)    Protein, ur 30 (*)    All other components within normal limits  CBG MONITORING, ED - Abnormal; Notable for the following components:    Glucose-Capillary 136 (*)    All other components within normal limits  CBC    EKG EKG Interpretation  Date/Time:  Wednesday June 11 2020 14:37:39 EDT Ventricular Rate:  113 PR Interval:  190 QRS Duration: 90 QT Interval:  326 QTC Calculation: 447 R Axis:   26 Text Interpretation: Sinus tachycardia Indeterminate axis Inferior infarct , age undetermined Cannot rule out Anterior infarct , age undetermined Abnormal ECG rate faster since prior 4/18  Confirmed by Aletta Edouard [phone]) on 06/11/2020 3:03:20 PM   Radiology No results found.  Procedures Procedures (including critical care time)  Medications Ordered in ED Medications  sodium chloride 0.9 % bolus 1,000 mL (0 mLs Intravenous Stopped 06/11/20 1849)  potassium chloride SA (KLOR-CON) CR tablet 20 mEq (20 mEq Oral Given 06/11/20 1645)    ED Course  I have reviewed the triage vital signs and the nursing notes.  Pertinent labs & imaging results that were available during my care of the patient were reviewed by me and considered in my medical decision making (see chart for details).  Clinical Course as of Jun 12 1136  Wed Jun 11, 2020  1916 Patient's orthostatics good here.  Will discharge and have follow-up with PCP.   [MB]    Clinical Course User Index [MB] Hayden Rasmussen, MD   MDM Rules/Calculators/A&P                         This patient complains of syncope in the setting of recent constipation and taking a lot of laxatives.; this involves an extensive number of treatment Options and is a complaint that carries with it a high risk of complications and Morbidity. The differential includes vasovagal syncope, hypovolemia, arrhythmia, metabolic derangement, anemia, GI bleed  I ordered, reviewed and interpreted labs, which included CBC with normal white count normal hemoglobin, chemistries with mildly low potassium and elevated creatinine, likely reflecting some dehydration I ordered medication IV fluids Previous  records obtained and reviewed in epic, no recent visits  After the interventions stated above, I reevaluated the patient and found patient symptoms to be improved.  Orthostatics negative.  Return instructions discussed.   Final Clinical Impression(s) / ED Diagnoses Final diagnoses:  Syncope and collapse  Dehydration  Hypokalemia    Rx / DC Orders ED Discharge Orders    None       Hayden Rasmussen, MD 06/12/20 1144

## 2020-06-11 NOTE — Telephone Encounter (Signed)
Routing to Angie 

## 2020-06-11 NOTE — Telephone Encounter (Signed)
Pt said he went 3 days without bm.  Pt said that he decided to take 3 laxatives between yesterday and last night.  Pt said that his last meal was breakfast of yesterday.  Pt said he passed out in line at [employer].  He said he is feeling weak.  Pt said EMS checked him out and vital signs were good.  He drank a gatorade while at [employer].  Pt is getting ready to eat lunch.  Pt wants to know what he can take to get his bm's back regulated.

## 2020-06-11 NOTE — Discharge Instructions (Addendum)
You were seen in the emergency department for evaluation after a fainting spell at [facility].  Your lab work showed you to be dehydrated.  You were given some fluids with improvement in your symptoms.  Please continue to drink plenty of fluids.  Your potassium was also mildly low and we gave you an oral supplement for that.  Please follow-up with your regular doctor.  Return to the emergency department for any worsening or concerning symptoms.

## 2020-06-11 NOTE — Telephone Encounter (Signed)
Pt is scheduled to see Dr [name] on 9/22 at 11am for constipation and a change in bowel movements. He has questions about medications to make his bowels move. Please call [phone]

## 2020-06-16 ENCOUNTER — Encounter: Payer: Self-pay | Admitting: Gastroenterology

## 2020-06-16 ENCOUNTER — Other Ambulatory Visit: Payer: Self-pay

## 2020-06-16 ENCOUNTER — Ambulatory Visit (INDEPENDENT_AMBULATORY_CARE_PROVIDER_SITE_OTHER): Payer: Medicare HMO | Admitting: Gastroenterology

## 2020-06-16 DIAGNOSIS — Z85038 Personal history of other malignant neoplasm of large intestine: Secondary | ICD-10-CM

## 2020-06-16 DIAGNOSIS — K5900 Constipation, unspecified: Secondary | ICD-10-CM

## 2020-06-16 NOTE — Progress Notes (Signed)
Primary Care Physician:  Scott Rim, MD  Primary Gastroenterologist:  Scott Oliver. Scott Chatters, DO   Chief Complaint  Patient presents with  . Constipation    didn't have bm 2-3 days, took bunch of laxatives then ended up having diarrhea; passed out at [employer]    HPI:  Scott Oliver is a 74 y.o. male here for further evaluation of constipation.  Patient was last seen in 2015.  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  Follow-up colonoscopy in 2012 was unremarkable.  His last colonoscopy was in November 2015.  He has diverticulosis, single tubular adenoma removed, large internal hemorrhoids.  Plans for surveillance colonoscopy in 5 years.  Patient called in last week reporting going 3 days without a bowel movement.  He decided to take multiple laxatives, 3 Dulcolax tablets, Dulcolax liquid, and another laxative he cannot recall the name.  He had multiple bowel movements throughout the evening and night.  The following day while he was out at [place] he passed out.  He states she did not fall or hurt anything.  He was out for just seconds.  Someone in line caught him.  He went to the emergency department for evaluation.  He was found to have dehydration.  Potassium 3.1, creatinine of 1.48.  BUN 15.  CBC unremarkable..  At baseline he has intermittent constipation.  If he goes 2 days without a stool, he typically takes 1 Dulcolax and will have results the next morning.  This time he did not good results returned several of the laxatives which contributed to having significant diarrhea.  Patient has not had a significant bowel movement since last week.  He passed a little bit of solid twice but mostly watery stool.  Not a significant amount however.  Started eating better this weekend.  Denies abdominal pain.  No blood in stool or melena.  No upper GI symptoms.   Current Outpatient Medications  Medication Sig Dispense  Refill  . aspirin EC 81 MG tablet Take 81 mg by mouth daily.    Marland Kitchen lisinopril-hydrochlorothiazide (PRINZIDE,ZESTORETIC) 20-12.5 MG per tablet Take 0.5 tablets by mouth daily.     Marland Kitchen MAGNESIUM CITRATE PO Take by mouth. Takes 2 tablets once a day    . pravastatin (PRAVACHOL) 80 MG tablet Take 40 mg by mouth every evening.    . tamsulosin (FLOMAX) 0.4 MG CAPS capsule Take 0.4 mg by mouth at bedtime.     No current facility-administered medications for this visit.    Allergies as of 06/16/2020  . (No Known Allergies)    Past Medical History:  Diagnosis Date  . Cancer [facility])    right hemicolectomy  . Cholelithiasis   . History of kidney stones   . HTN (hypertension)   . Hyperlipemia   . Microscopic hematuria     Past Surgical History:  Procedure Laterality Date  . CATARACT EXTRACTION W/PHACO Left 02/17/2017   Procedure: CATARACT EXTRACTION PHACO AND INTRAOCULAR LENS PLACEMENT (IOC) CDE - 9.17 ;  Surgeon: Scott Branch, MD;  Location: [facility];  Service: Ophthalmology;  Laterality: Left;  left  . CATARACT EXTRACTION W/PHACO Right 03/24/2017   Procedure: CATARACT EXTRACTION PHACO AND INTRAOCULAR LENS PLACEMENT RIGHT EYE CDE=12.03;  Surgeon: Scott Branch, MD;  Location: [facility];  Service: Ophthalmology;  Laterality: Right;  right  . COLON SURGERY  NOV 2011 [facility]   R HEMI  . COLONOSCOPY  AUG 2011 [facility]   HF POLYP  W/ FOCI OF ADENO CA, SIMPLE ADENOMAS  . COLONOSCOPY  08/09/2011   Dr. Oneida Oliver. rare diverticula sigmiod colon, moderate internal hemorrhoids  . COLONOSCOPY N/A 09/11/2014   Dr. Oneida Oliver: Diverticulosis, single tubular adenoma removed, large internal hemorrhoids.  Next colonoscopy in 5 years due to high risk, personal history of colon cancer    Family History  Problem Relation Age of Onset  . Colon cancer Neg Hx   . Colon polyps Neg Hx   . Anesthesia problems Neg Hx   . Hypotension Neg Hx   . Malignant hyperthermia Neg Hx   . Pseudochol deficiency Neg Hx     Social History    Socioeconomic History  . Marital status: Widowed    Spouse name: Not on file  . Number of children: Not on file  . Years of education: Not on file  . Highest education level: Not on file  Occupational History  . Not on file  Tobacco Use  . Smoking status: Never Smoker  . Smokeless tobacco: Never Used  Substance and Sexual Activity  . Alcohol use: Yes    Comment: ocassional  . Drug use: No  . Sexual activity: Not Currently    Birth control/protection: None  Other Topics Concern  . Not on file  Social History Narrative   HIS WIFE, Scott Oliver, PASSED AWAY RECENTLY.   Social Determinants of Health   Financial Resource Strain:   . Difficulty of Paying Living Expenses: Not on file  Food Insecurity:   . Worried About Charity fundraiser in the Last Year: Not on file  . Ran Out of Food in the Last Year: Not on file  Transportation Needs:   . Lack of Transportation (Medical): Not on file  . Lack of Transportation (Non-Medical): Not on file  Physical Activity:   . Days of Exercise per Week: Not on file  . Minutes of Exercise per Session: Not on file  Stress:   . Feeling of Stress : Not on file  Social Connections:   . Frequency of Communication with Friends and Family: Not on file  . Frequency of Social Gatherings with Friends and Family: Not on file  . Attends Religious Services: Not on file  . Active Member of Clubs or Organizations: Not on file  . Attends Archivist Meetings: Not on file  . Marital Status: Not on file  Intimate Partner Violence:   . Fear of Current or Ex-Partner: Not on file  . Emotionally Abused: Not on file  . Physically Abused: Not on file  . Sexually Abused: Not on file      ROS:  General: Negative for anorexia, weight loss, fever, chills, fatigue, weakness. Eyes: Negative for vision changes.  ENT: Negative for hoarseness, difficulty swallowing , nasal congestion. CV: Negative for chest pain, angina, palpitations, dyspnea on  exertion, peripheral edema.  Respiratory: Negative for dyspnea at rest, dyspnea on exertion, cough, sputum, wheezing.  GI: See history of present illness. GU:  Negative for dysuria, hematuria, urinary incontinence, urinary frequency, nocturnal urination.  MS: Negative for joint pain, low back pain.  Derm: Negative for rash or itching.  Neuro: Negative for weakness, abnormal sensation, seizure, frequent headaches, memory loss, confusion.  Psych: Negative for anxiety, depression, suicidal ideation, hallucinations.  Endo: Negative for unusual weight change.  Heme: Negative for bruising or bleeding. Allergy: Negative for rash or hives.    Physical Examination:  BP 123/70   Pulse 93   Temp (!) 97.1 F (36.2 C) (Temporal)  Ht 6\' 2"  (1.88 m)   Wt 200 lb 12.8 oz (91.1 kg)   BMI 25.78 kg/m    General: Well-nourished, well-developed in no acute distress.  Head: Normocephalic, atraumatic.   Eyes: Conjunctiva pink, no icterus. Mouth: masked Neck: Supple without thyromegaly, masses, or lymphadenopathy.  Lungs: Clear to auscultation bilaterally.  Heart: Regular rate and rhythm, no murmurs rubs or gallops.  Abdomen: Bowel sounds are normal, nontender, nondistended, no hepatosplenomegaly or masses, no abdominal bruits or    hernia , no rebound or guarding.   Rectal: not performed Extremities: No lower extremity edema. No clubbing or deformities.  Neuro: Alert and oriented x 4 , grossly normal neurologically.  Skin: Warm and dry, no rash or jaundice.   Psych: Alert and cooperative, normal mood and affect.  Labs: Lab Results  Component Value Date   CREATININE 1.48 (H) 06/11/2020   BUN 15 06/11/2020   NA 135 06/11/2020   K 3.1 (L) 06/11/2020   CL 100 06/11/2020   CO2 25 06/11/2020   Lab Results  Component Value Date   ALT 23 08/31/2010   AST 22 08/31/2010   ALKPHOS 59 08/31/2010   BILITOT 1.1 08/31/2010   Lab Results  Component Value Date   WBC 8.8 06/11/2020   HGB 15.1  06/11/2020   HCT 45.4 06/11/2020   MCV 94.0 06/11/2020   PLT 207 06/11/2020     Imaging Studies: No results found.  Impression/Plan:  Pleasant 74 year old gentleman with history of hepatic flexure polyp containing adenocarcinoma requiring right hemicolectomy in 2011, presenting for follow-up of recent constipation.  Patient is overdue for surveillance colonoscopy at this time (was due November 2020).  Historically has intermittent constipation, takes Dulcolax 1 tablet with good results.  Last week did not get results right away and decided to take additional laxatives resulting in significant diarrhea.  Suspect syncopal episode due to dehydration, ED evaluation as outlined above.  At this time his stools have not returned to normal.  He has started eating better over the last couple of days.  Reassured him that his bowel function should return in the next couple of days.  Will start Fiberchoice chew 2 daily.  Samples provided.  He needs to consider colonoscopy in the near future but he wants to wait to see that his stools function returns to normal.  We will have him come back in 6 weeks and plan for a colonoscopy at that time.  If he has any issues in the interim, he is to call us.  With scheduling colonoscopy, we will need to reinforce the need for him to hydrate well prior to the bowel prep to prevent repeat dehydration and syncope.  Consider large volume prep.

## 2020-06-16 NOTE — Patient Instructions (Signed)
1. Start FiberChoice chew 2 daily. Samples provided. Call if bowel movements do not return to normal. 2. Return to the office in six weeks to scheduled colonoscopy.

## 2020-06-17 NOTE — Progress Notes (Signed)
CC'ED TO PCP 

## 2020-07-16 ENCOUNTER — Ambulatory Visit: Payer: Medicare HMO | Admitting: Internal Medicine

## 2020-08-06 ENCOUNTER — Ambulatory Visit: Payer: Medicare HMO | Admitting: Gastroenterology
